# Patient Record
Sex: Female | Born: 1975 | Race: White | Hispanic: No | Marital: Married | Smoking: Never smoker
Health system: Southern US, Community
[De-identification: ages and names within clinical notes are randomized; demographics above are authoritative.]

## PROBLEM LIST (undated history)

## (undated) DIAGNOSIS — F419 Anxiety disorder, unspecified: Secondary | ICD-10-CM

## (undated) DIAGNOSIS — F329 Major depressive disorder, single episode, unspecified: Secondary | ICD-10-CM

## (undated) DIAGNOSIS — E039 Hypothyroidism, unspecified: Secondary | ICD-10-CM

## (undated) DIAGNOSIS — J45909 Unspecified asthma, uncomplicated: Secondary | ICD-10-CM

## (undated) DIAGNOSIS — F32A Depression, unspecified: Secondary | ICD-10-CM

## (undated) HISTORY — PX: CHOLECYSTECTOMY: SHX55

## (undated) HISTORY — PX: FOOT SURGERY: SHX648

## (undated) HISTORY — DX: Unspecified asthma, uncomplicated: J45.909

## (undated) HISTORY — PX: LYMPH NODE DISSECTION: SHX5087

## (undated) HISTORY — PX: ABDOMINAL HYSTERECTOMY: SHX81

## (undated) HISTORY — DX: Anxiety disorder, unspecified: F41.9

## (undated) HISTORY — PX: TONSILLECTOMY: SUR1361

## (undated) HISTORY — DX: Depression, unspecified: F32.A

## (undated) HISTORY — DX: Hypothyroidism, unspecified: E03.9

## (undated) HISTORY — DX: Major depressive disorder, single episode, unspecified: F32.9

---
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
## 1998-09-27 ENCOUNTER — Other Ambulatory Visit: Admission: RE | Admit: 1998-09-27 | Discharge: 1998-09-27 | Payer: Self-pay | Admitting: *Deleted

## 1999-03-26 ENCOUNTER — Encounter: Payer: Self-pay | Admitting: *Deleted

## 1999-03-26 ENCOUNTER — Ambulatory Visit (HOSPITAL_COMMUNITY): Admission: RE | Admit: 1999-03-26 | Discharge: 1999-03-26 | Payer: Self-pay | Admitting: *Deleted

## 1999-06-06 ENCOUNTER — Inpatient Hospital Stay (HOSPITAL_COMMUNITY): Admission: AD | Admit: 1999-06-06 | Discharge: 1999-06-06 | Payer: Self-pay | Admitting: *Deleted

## 1999-06-21 ENCOUNTER — Inpatient Hospital Stay (HOSPITAL_COMMUNITY): Admission: AD | Admit: 1999-06-21 | Discharge: 1999-07-01 | Payer: Self-pay | Admitting: *Deleted

## 1999-06-26 ENCOUNTER — Encounter: Payer: Self-pay | Admitting: *Deleted

## 1999-07-06 ENCOUNTER — Inpatient Hospital Stay (HOSPITAL_COMMUNITY): Admission: AD | Admit: 1999-07-06 | Discharge: 1999-07-08 | Payer: Self-pay | Admitting: *Deleted

## 1999-07-09 ENCOUNTER — Encounter (HOSPITAL_COMMUNITY): Admission: RE | Admit: 1999-07-09 | Discharge: 1999-10-07 | Payer: Self-pay | Admitting: *Deleted

## 2001-08-24 ENCOUNTER — Encounter: Payer: Self-pay | Admitting: Emergency Medicine

## 2001-08-24 ENCOUNTER — Emergency Department (HOSPITAL_COMMUNITY): Admission: EM | Admit: 2001-08-24 | Discharge: 2001-08-24 | Payer: Self-pay | Admitting: Emergency Medicine

## 2003-02-20 ENCOUNTER — Other Ambulatory Visit: Admission: RE | Admit: 2003-02-20 | Discharge: 2003-02-20 | Payer: Self-pay | Admitting: Obstetrics and Gynecology

## 2004-02-26 ENCOUNTER — Ambulatory Visit (HOSPITAL_COMMUNITY): Admission: RE | Admit: 2004-02-26 | Discharge: 2004-02-26 | Payer: Self-pay | Admitting: Family Medicine

## 2004-04-12 ENCOUNTER — Ambulatory Visit (HOSPITAL_COMMUNITY): Admission: RE | Admit: 2004-04-12 | Discharge: 2004-04-12 | Payer: Self-pay | Admitting: Otolaryngology

## 2004-04-12 ENCOUNTER — Encounter (INDEPENDENT_AMBULATORY_CARE_PROVIDER_SITE_OTHER): Payer: Self-pay | Admitting: Specialist

## 2004-04-12 ENCOUNTER — Ambulatory Visit (HOSPITAL_BASED_OUTPATIENT_CLINIC_OR_DEPARTMENT_OTHER): Admission: RE | Admit: 2004-04-12 | Discharge: 2004-04-12 | Payer: Self-pay | Admitting: Otolaryngology

## 2005-03-14 ENCOUNTER — Other Ambulatory Visit: Admission: RE | Admit: 2005-03-14 | Discharge: 2005-03-14 | Payer: Self-pay | Admitting: Obstetrics and Gynecology

## 2006-12-09 ENCOUNTER — Emergency Department (HOSPITAL_COMMUNITY): Admission: EM | Admit: 2006-12-09 | Discharge: 2006-12-09 | Payer: Self-pay | Admitting: Emergency Medicine

## 2007-09-24 ENCOUNTER — Observation Stay (HOSPITAL_COMMUNITY): Admission: EM | Admit: 2007-09-24 | Discharge: 2007-09-25 | Payer: Self-pay | Admitting: Neurosurgery

## 2007-09-24 ENCOUNTER — Encounter: Payer: Self-pay | Admitting: Emergency Medicine

## 2007-12-02 ENCOUNTER — Ambulatory Visit (HOSPITAL_COMMUNITY): Admission: RE | Admit: 2007-12-02 | Discharge: 2007-12-03 | Payer: Self-pay | Admitting: Obstetrics and Gynecology

## 2007-12-02 ENCOUNTER — Encounter (INDEPENDENT_AMBULATORY_CARE_PROVIDER_SITE_OTHER): Payer: Self-pay | Admitting: Obstetrics and Gynecology

## 2008-07-24 ENCOUNTER — Ambulatory Visit (HOSPITAL_COMMUNITY): Admission: RE | Admit: 2008-07-24 | Discharge: 2008-07-24 | Payer: Self-pay | Admitting: Family Medicine

## 2008-12-06 IMAGING — CR DG FINGER THUMB 2+V*R*
1 series · 1 of 1 positions shown · non-contrast
Comparison: None

CLINICAL DATA: Pain, kicked by horse

RIGHT THUMB 2+V

[view not recorded]
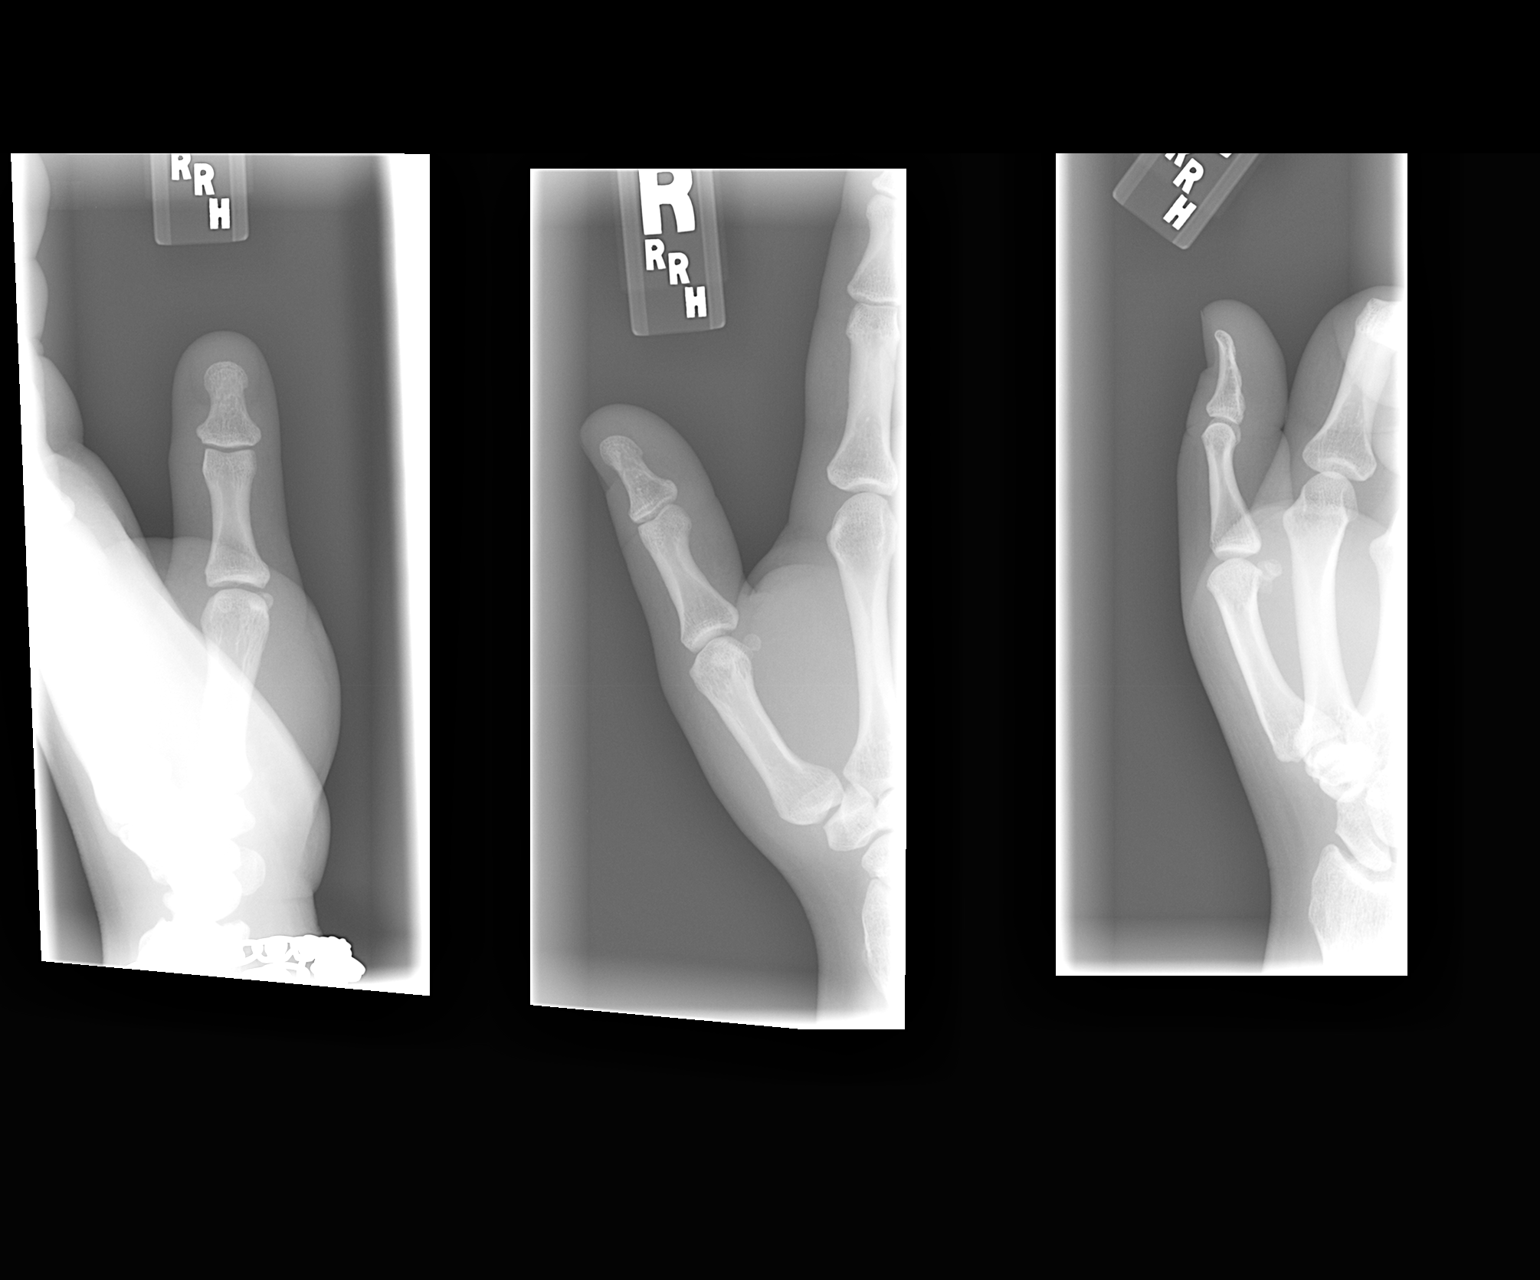

[1 of 1 positions shown; findings below may reference images not displayed]

FINDINGS: Bone mineralization normal.
Joint spaces preserved.
No fracture, dislocation, or bone destruction.
Soft tissue swelling overlies radial margin of first metacarpal.
The
IMPRESSION: No acute bony abnormalities.

## 2011-05-06 NOTE — H&P (Signed)
NAME:  Jill James, Jill James                   ACCOUNT NO.:  [id number]   MEDICAL RECORD NO.:  [id number]          PATIENT TYPE:  OIB   LOCATION:  9399                          FACILITY:  [facility]   PHYSICIAN:  Juluis Mire, M.D.   DATE OF BIRTH:  09-28-1976   DATE OF ADMISSION:  12/02/2007  DATE OF DISCHARGE:                              HISTORY & PHYSICAL   A 35 year old gravida 1, para 1 female who presents for laparoscopic-  assisted vaginal hysterectomy.   The patient has a longstanding history of abnormal uterine bleeding.  She had basically had an anovulatory cycle in the past with prolonged  periods.  She had a saline infusion ultrasound in 2006 that was normal.  She has had blood workup including a normal thyroid and prolactin.  We  have attempted to control it hormonally with things such as progesterone  which has been unresponsive.  After discussion of options, the patient  now wishes to proceed with laparoscopic-assisted vaginal hysterectomy.  Other alternatives such as birth control pills and attempts of  Prometrium have been declined.   ALLERGIES:  No known drug allergies.   MEDICATIONS:  Phentermine.   PAST MEDICAL HISTORY:  Usual childhood diseases.  No significant  sequelae.   PAST SURGICAL HISTORY:  She has no previous surgical history.   OBSTETRICAL HISTORY:  She has had one vaginal delivery.   FAMILY HISTORY:  There is a family history of pulmonary disease probably  from smoking and lung cancer.   SOCIAL HISTORY:  No tobacco or alcohol use.   REVIEW OF SYSTEMS:  Noncontributory.   PHYSICAL EXAMINATION:  VITAL SIGNS:  The patient is afebrile with stable  vital signs.  HEENT:  The patient is normocephalic.  Pupils equal, round, reactive to  light and accommodation.  Extraocular movements were intact.  Sclerae  and conjunctivae were clear.  NECK:  No thyromegaly.  LUNGS:  Clear.  CARDIOVASCULAR:  Regular rate without murmurs or gallops.  ABDOMEN:  Benign.  No mass,  organomegaly or tenderness.  PELVIC:  Normal external genitalia.  Vaginal mucosa is clear.  Cervix is  unremarkable.  Uterus is upper limits of normal in size, mildly boggy.  Adnexa unremarkable.  EXTREMITIES:  Trace edema.  NEUROLOGIC:  Grossly within normal limits.   IMPRESSION:  Abnormal uterine bleeding, unresponsive to conservative  management.   PLAN:  The patient to undergo laparoscopic-assisted vaginal  hysterectomy.  Alternatives again were discussed.  The risks of the  surgery were explained including the risk of infection, the risk of  hemorrhage that could require transfusion with the risk of AIDS or  hepatitis, the risk of injury to adjacent organs including bladder,  bowel, ureters that could require further exploratory surgery.  The risk  of deep venous thrombosis and pulmonary emboli.  The patient does also  understand she will not be able to conceive after this.  The patient  expressed her understanding of the indications, risks and other  alternatives.      Juluis Mire, M.D.  Electronically Signed     JSM/MEDQ  D:  12/02/2007  T:  12/02/2007  Job:  [id number]

## 2011-05-06 NOTE — Discharge Summary (Signed)
Jill James, Jill James                   ACCOUNT NO.:  [id number]   MEDICAL RECORD NO.:  [id number]          PATIENT TYPE:  OIB   LOCATION:  9316                          [facility]ACILITY:  [facility]   PHYSICIAN:  Juluis Mire, M.D.   DATE OF BIRTH:  1976/05/30   DATE OF ADMISSION:  12/02/2007  DATE OF DISCHARGE:  12/03/2007                               DISCHARGE SUMMARY   ADMITTING DIAGNOSIS:  Abnormal uterine bleeding.   DISCHARGE DIAGNOSIS:  Abnormal uterine bleeding.   PROCEDURE:  Laparoscopic-assisted vaginal hysterectomy.   For complete history and physical please see dictated note.   COURSE IN HOSPITAL:  The patient underwent the above-noted surgery.  Postop did well.  Postop hemoglobin was 11.4.  She was discharged home  on her first postoperative day.  At that time she was afebrile.  Her  vital signs were stable.  Her abdominal exam was unremarkable.  All  incisions were clear.  She was voiding without difficulty, had no active  bleeding.  The patient is discharged home in stable condition.   In terms of complications, none were encountered during her stay in the  hospital.  In terms of disposition, she will be taking Tylox as needed  for pain at home.  She will follow up in the office in 1 week.  She is  instructed to avoid heavy lifting, vaginal entrance or driving of a car.  She is to watch for signs of infection, any vomiting, increasing  abdominal pain, active vaginal bleeding, signs of deep venous  thrombosis, or pulmonary embolus.      Juluis Mire, M.D.  Electronically Signed     JSM/MEDQ  D:  12/03/2007  T:  12/03/2007  Job:  [id number]

## 2011-05-06 NOTE — Op Note (Signed)
NAMEFRANCENE, MCERLEAN                   ACCOUNT NO.:  [id number]   MEDICAL RECORD NO.:  [id number]          PATIENT TYPE:  OIB   LOCATION:  9399                          [facility]ILITY[facility]   PHYSICIAN:  Juluis Mire, M.D.   DATE OF BIRTH:  [birth year]   DATE OF PROCEDURE:  12/02/2007  DATE OF DISCHARGE:                               OPERATIVE REPORT   PREOPERATIVE DIAGNOSIS:  Abnormal uterine bleeding.   POSTOPERATIVE DIAGNOSIS:  Abnormal uterine bleeding.   OPERATIVE PROCEDURE:  Laparoscopically assisted vaginal hysterectomy.   SURGEON:  Juluis Mire, M.D.   ASSISTANT:  Duke Salvia. Marcelle Overlie, M.D.   ANESTHESIA:  General endotracheal.   ESTIMATED BLOOD LOSS:  300 mL.   PACKS DRAINS:  None.   INTRAOPERATIVE BLOOD REPLACED:  None.   COMPLICATIONS:  None.   INDICATIONS:  Noted in history and physical.   PROCEDURE:  The patient was taken to the OR, placed supine position.  After satisfactory level of general endotracheal anesthesia was  obtained, the patient was placed in dorsal lithotomy position using the  Allen stirrups.  At this point time the abdomen, perineum and vagina  cleansed with Betadine.  Bladder was emptied by in-and-out  catheterization.  A Hulka tenaculum was put in place.  Patient was  draped in sterile field.  Subumbilical incision was made with a knife.  Veress needle was introduced into the abdominal cavity.  Abdomen was  inflated with approximately 3 liters of carbon dioxide.  The operating  laparoscope was introduced.  There is no evidence of injury to adjacent  organs 5-mm trocar was put in place in suprapubic area under direct  visualization.  Visualization revealed the uterus to be normal.  Tubes  and ovaries were unremarkable.  Cul-de-sac was clear.  Appendix was  retrocecal unremarkable.  Upper abdomen, liver and tip of the  gallbladder were normal.  Lateral gutters were normal.  Next using the  gyrus the right utero-ovarian pedicle was cauterized incised.   The right  round ligament was cauterized incised.  We then went to the left side.  The left utero-ovarian pedicle was cauterized and incised.  The left  round ligament was cauterized and incised.  We had good hemostasis and  freeing of the adnexa.  This point time the laparoscope was removed, the  abdomen was deflated of carbon dioxide.   The patient's legs were repositioned.  The Hulka was then removed.  Weighted speculum was placed in vaginal vault.  Cervix was grasped with  Christella Hartigan tenaculum.  Cul-de-sac was entered sharply.  Both uterosacral  ligaments were clamped, cut and suture ligated with 0 Vicryl.  The  reflection of vaginal mucosa anteriorly was incised and bladder was  dissected superiorly.  Vesicouterine space was identified and entered  sharply and retractor was put in place.  Using clamped, cut and tie  technique with suture ligatures of 0 Vicryl.  The parametrium serially  separated from the sides of the uterus.  Uterus then flipped, remaining  pedicles were clamped and cut.  Uterus and cervix passed off the  operative field  sent to pathology.  Held pedicles secured with free tie  of 0 Vicryl.  Uterosacral plication stitch of 0  Vicryl was put in place  and secured.  Vaginal mucosa was reapproximated in vertical fashion with  figure-of-eights of 0 Monocryl.  Foley was placed straight drain with  clear urine.   The patient's legs repositioned.  Laparoscope was reintroduced.  Minimal  bleeding was noted from the cuff, brought under control with the Gyrus.  The ovaries were hemostatically intact.  We irrigated the pelvis,  removed irrigation.  At this point in time the abdomen was deflated of  its carbon dioxide.  All trocars removed.  Subumbilical incision was  closed interrupted subcuticulars of 4-0 Vicryl.  The suprapubic incision  closed with Dermabond.  The patient taken out of dorsal lithotomy  position.  Once alert and extubated transferred to recovery room good   condition.  Sponge, instrument and needle count was reported correct by  circulating nurse x2.      Juluis Mire, M.D.  Electronically Signed     JSM/MEDQ  D:  12/02/2007  T:  12/02/2007  Job:  [id number]
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
## 2011-05-09 NOTE — Op Note (Signed)
NAME:  Jill James, Jill James                          ACCOUNT NO.:  [id number]   MEDICAL RECORD NO.:  [id number]                   PATIENT TYPE:  AMB   LOCATION:  DSC                                  FACILITY:  [facility]   PHYSICIAN:  Lucky Cowboy, M.D.                    DATE OF BIRTH:  1976/07/11   DATE OF PROCEDURE:  04/12/2004  DATE OF DISCHARGE:                                 OPERATIVE REPORT   PREOPERATIVE DIAGNOSIS:  Right neck mass.   POSTOPERATIVE DIAGNOSIS:  Right neck mass.   PROCEDURE:  Excisional biopsy, right neck mass.   SURGEON:  Lucky Cowboy, M.D.   ANESTHESIA:  General.   ESTIMATED BLOOD LOSS:  None.   COMPLICATIONS:  None.   INDICATIONS:  This patient is a 35 year old female who has a 78-month history  of painful and gradually enlarging right zone 2 neck mass.  She has been  tried on antibiotic therapy without regression.  For these reasons,  excisional biopsy is performed.   DESCRIPTION OF PROCEDURE:  The patient was taken to the operating room and  placed on the table in the supine position.  She was then placed under  general endotracheal anesthesia, and the table rotated counter-clockwise 90  degrees.  The neck was gently extended.  The right neck was prepped with  Betadine and draped in the usual sterile fashion.  The area overlying the  lymph node was marked.  A transverse incision was made which was 3 cm. This  was made using a 15 blade and subcutaneous tissues divided with scissors and  Bovie cautery.  The lymph node was identified and measured approximately 2  cm x 1.5 cm in dimension.  It was sharply dissected using scissors, staying  immediately adjacent to the capsule.  In this manner, it was dissected off  the sternocleidomastoid muscle and adjacent vein.  The platysmal muscle was  reapproximated in a simple, interrupted buried fashion using 4-0 Vicryl.  The skin was closed in a simple, interrupted fashion using 5-0 Prolene.  Bacitracin ointment was applied,  and the patient awakened from anesthesia.  She was taken to the post-anesthesia care unit in stable condition.   COMPLICATIONS:  None.                                               Lucky Cowboy, M.D.    SJ/MEDQ  D:  04/12/2004  T:  04/13/2004  Job:  [id number]   cc:   [facility]   Donna Bernard, M.D.  [address]  [place]  [place] [address]  Fax: [phone]

## 2011-06-10 ENCOUNTER — Other Ambulatory Visit: Payer: Self-pay | Admitting: Family Medicine

## 2011-06-10 DIAGNOSIS — R52 Pain, unspecified: Secondary | ICD-10-CM

## 2011-06-11 ENCOUNTER — Ambulatory Visit (HOSPITAL_COMMUNITY)
Admission: RE | Admit: 2011-06-11 | Discharge: 2011-06-11 | Disposition: A | Discharge status: Home / Self Care | Payer: Managed Care, Other (non HMO) | Source: Ambulatory Visit | Attending: Family Medicine | Admitting: Family Medicine

## 2011-06-11 DIAGNOSIS — R109 Unspecified abdominal pain: Secondary | ICD-10-CM | POA: Insufficient documentation

## 2011-06-11 DIAGNOSIS — R52 Pain, unspecified: Secondary | ICD-10-CM

## 2011-06-11 DIAGNOSIS — K8010 Calculus of gallbladder with chronic cholecystitis without obstruction: Secondary | ICD-10-CM | POA: Insufficient documentation

## 2011-06-12 ENCOUNTER — Encounter (HOSPITAL_COMMUNITY)
Admission: RE | Admit: 2011-06-12 | Discharge: 2011-06-12 | Disposition: A | Discharge status: Home / Self Care | Payer: Managed Care, Other (non HMO) | Source: Ambulatory Visit | Attending: General Surgery | Admitting: General Surgery

## 2011-06-12 LAB — BASIC METABOLIC PANEL
BUN: 11 mg/dL (ref 6–23)
CO2: 29 mEq/L (ref 19–32)
Chloride: 103 mEq/L (ref 96–112)
GFR calc Af Amer: >60 mL/min (ref >60)
Potassium: 4 mEq/L (ref 3.5–5.1)

## 2011-06-12 LAB — CBC
HCT: 40.8 % (ref 36.0–46.0)
Hemoglobin: 13.7 g/dL (ref 12.0–15.0)
RBC: 4.56 MIL/uL (ref 3.87–5.11)
RDW: 12.3 % (ref 11.5–15.5)
WBC: 6.8 10*3/uL (ref 4.0–10.5)

## 2011-06-13 ENCOUNTER — Ambulatory Visit (HOSPITAL_COMMUNITY)
Admission: RE | Admit: 2011-06-13 | Discharge: 2011-06-15 | Disposition: A | Discharge status: Home / Self Care | Payer: Managed Care, Other (non HMO) | Source: Ambulatory Visit | Attending: General Surgery | Admitting: General Surgery

## 2011-06-13 ENCOUNTER — Other Ambulatory Visit: Payer: Self-pay | Admitting: General Surgery

## 2011-06-13 DIAGNOSIS — K8020 Calculus of gallbladder without cholecystitis without obstruction: Secondary | ICD-10-CM | POA: Insufficient documentation

## 2011-06-13 DIAGNOSIS — R109 Unspecified abdominal pain: Secondary | ICD-10-CM | POA: Insufficient documentation

## 2011-06-13 DIAGNOSIS — Z01812 Encounter for preprocedural laboratory examination: Secondary | ICD-10-CM | POA: Insufficient documentation

## 2011-06-17 NOTE — Op Note (Signed)
Jill James, GEIS                   ACCOUNT NO.:  [id number]  MEDICAL RECORD NO.:  [id number]  LOCATION:                                 FACILITY:  PHYSICIAN:  Tilford Pillar, MD      DATE OF BIRTH:  06/13/1976  DATE OF PROCEDURE:  06/13/2011 DATE OF DISCHARGE:                              OPERATIVE REPORT   PREOPERATIVE DIAGNOSES:  Acute cholecystitis and cholelithiasis.  POSTOPERATIVE DIAGNOSES:  Acute cholecystitis and cholelithiasis.  PROCEDURE:  Laparoscopic cholecystectomy.  SURGEON:  Tilford Pillar, MD  ANESTHESIA:  General endotracheal, local anesthetic 0.5% Sensorcaine plain.  SPECIMEN:  Gallbladder.  ESTIMATED BLOOD LOSS:  Minimal.  INDICATIONS:  The patient is a 35 year old female who presented to my office with a history of right upper quadrant epigastric abdominal pain with radiation to the right back and shoulder.  Her workup and evaluation was consistent with acute cholecystitis and chololithiasis. Risks, benefits, and alternatives of laparoscopic possible open cholecystectomy were discussed at length with the patient including but not limited to bleeding, infection, bile leak, small bowel injury, common bile duct injury, as well as possibility of intraoperative cardiopulmonary events.  The patient's questions and concerns were addressed.  The patient was consented for planned procedure.  OPERATION:  The patient was taken to the operating room and was placed in supine position on the operating  room table.  At which time, a general anesthetic was administered.  Once the patient was asleep, she was endotracheally intubated by Anesthesia.  At this point, her abdomen was prepped with DuraPrep solution and draped in standard fashioned. Stab incision was created infraumbilically with 11-blade scalpel. Additional dissection down to the subcutaneous tissue was carried out using a Kocher clamp was utilized to grasp the anterior abdominal fascia anteriorly.  The  Veress needle was inserted.  Saline drop test was utilized to confirm  intraperitoneal placement and then pneumoperitoneum was initiated.  Once sufficient pneumoperitoneum was obtained, a 11-mm trocar was inserted over laparoscope allowing visualization of the trocar entering into peritoneal cavity.  At this time, the inner cannula was removed.  The laparoscope was reinserted.  No evidence of any trocar or Veress needle placement injury.  At this time, the remaining trocars were placed.  A 11-mm trocar was placed in the epigastrium,  a 5-mm trocar was placed in the midline between the two 11-mm trocars and the 5- mm trocars placed in the right lateral abdominal wall.  The fundus of the gallbladder was grasped and lifted up and over the right lobe of the liver.  Peritoneal reflection on the infundibulum was bluntly stripped using [facility], exposing the cystic duct as it enters into the infundibulum.  A window was created behind the cystic duct.  Three EndoClips were placed proximally, one distally, and the cystic duct was divided between the two most distal clips.  Similarly, the cystic artery was identified.  Two EndoClips were placed proximally and one distally and the cystic artery was divided between the two most distal clips. At this time, electrocautery utilized to dissect the gallbladder free from the gallbladder fossa.  Once it was free, it was placed in the EndoCatch bag  was placed over the right lobe of liver.  At this time, section of the gallbladder fossa indicated excellent hemostasis.  There was no evidence of any bleeding or bile leak. __________ ducts and artery.  At this point, attention was turned to closure.  At this point, an Endoclose suture passing device was utilized to pass 2-0 Vicryl sutures to both the 11-mm trocar sites.  With these sutures in place, the gallbladder was grasped and retrieved from the umbilical trocar site, intact in EndoCatch bag.   Gallbladder was placed on the back table and sent as a permanent specimen to Pathology.  At this time, the pneumoperitoneum was evacuated.  Trocars were removed. The Vicryl sutures were secured.  The local anesthetic was instilled, 4- 0 Monocryl was utilized to reapproximate skin edges at all 4 trocar sites.  Skin was washed and dried with moist and dry towel.  Benzoin was applied around incision.  Half-inch Steri-Strips were placed.  Drapes removed.  The patient was allowed to come out of general anesthetic. The patient was transferred back to regular hospital bed in stable condition.  At the conclusion of the procedure, all instrument, sponge, and needle counts were correct.  The patient tolerated the procedure extremely well.     Tilford Pillar, MD     BZ/MEDQ  D:  06/16/2011  T:  06/16/2011  Job:  [id number]  Electronically Signed by Tilford Pillar MD on 06/17/2011 05:46:39 PM

## 2011-09-29 LAB — CBC
HCT: 41.9
Hemoglobin: 11.4 — ABNORMAL LOW
Hemoglobin: 14.4
MCHC: 34.2
Platelets: 301
RBC: 3.76 — ABNORMAL LOW
WBC: 5
WBC: 9.2

## 2011-10-02 LAB — DIFFERENTIAL
Basophils Absolute: 0.1
Basophils Relative: 1
Eosinophils Absolute: 0.5
Eosinophils Relative: 5
Monocytes Absolute: 0.6
Monocytes Relative: 6

## 2011-10-02 LAB — BASIC METABOLIC PANEL
CO2: 27
Calcium: 9.1
Chloride: 103
GFR calc Af Amer: >60
Glucose, Bld: 95
Sodium: 137

## 2011-10-02 LAB — CBC
HCT: 40.9
Hemoglobin: 13.9
MCHC: 34.1
MCV: 87.1
RBC: 4.69
RDW: 12.1

## 2011-10-02 LAB — APTT: aPTT: 29

## 2013-06-07 ENCOUNTER — Telehealth: Payer: Self-pay | Admitting: Family Medicine

## 2013-06-07 NOTE — Telephone Encounter (Signed)
error 

## 2013-06-09 ENCOUNTER — Ambulatory Visit: Payer: Self-pay | Admitting: Family Medicine

## 2013-06-29 ENCOUNTER — Telehealth: Payer: Self-pay | Admitting: Family Medicine

## 2013-06-29 DIAGNOSIS — E039 Hypothyroidism, unspecified: Secondary | ICD-10-CM

## 2013-06-29 MED ORDER — LEVOTHYROXINE SODIUM 25 MCG PO TABS: 25.0000 ug | ORAL_TABLET | Freq: Every day | ORAL | Status: DC

## 2013-06-29 NOTE — Telephone Encounter (Signed)
30 d only. tsh plus ov

## 2013-06-29 NOTE — Telephone Encounter (Signed)
Patient needs a refill of her levothyroxine to [facility]

## 2013-06-29 NOTE — Telephone Encounter (Signed)
Discussed with patient. Med faxed to [facility]. Blood work papers printed and left up front for patient pick up. Patient scheduled follow up office visit.

## 2013-06-29 NOTE — Telephone Encounter (Signed)
Not seen in over a year. LAST SEEN 05/13/2012

## 2013-07-01 ENCOUNTER — Encounter: Payer: Self-pay | Admitting: *Deleted

## 2013-07-06 ENCOUNTER — Encounter: Payer: Self-pay | Admitting: Nurse Practitioner

## 2013-07-06 ENCOUNTER — Ambulatory Visit (INDEPENDENT_AMBULATORY_CARE_PROVIDER_SITE_OTHER): Payer: BC Managed Care – PPO | Admitting: Nurse Practitioner

## 2013-07-06 VITALS — BP 122/86 | HR 70 | Ht 60.75 in | Wt 180.0 lb

## 2013-07-06 DIAGNOSIS — F418 Other specified anxiety disorders: Secondary | ICD-10-CM

## 2013-07-06 DIAGNOSIS — F341 Dysthymic disorder: Secondary | ICD-10-CM

## 2013-07-06 DIAGNOSIS — Z0000 Encounter for general adult medical examination without abnormal findings: Secondary | ICD-10-CM

## 2013-07-06 DIAGNOSIS — J300 Vasomotor rhinitis: Secondary | ICD-10-CM

## 2013-07-06 DIAGNOSIS — J309 Allergic rhinitis, unspecified: Secondary | ICD-10-CM

## 2013-07-06 DIAGNOSIS — Z01419 Encounter for gynecological examination (general) (routine) without abnormal findings: Secondary | ICD-10-CM

## 2013-07-06 DIAGNOSIS — Z23 Encounter for immunization: Secondary | ICD-10-CM

## 2013-07-06 MED ORDER — CITALOPRAM HYDROBROMIDE 20 MG PO TABS: 20.0000 mg | ORAL_TABLET | Freq: Every day | ORAL | Status: DC

## 2013-07-06 NOTE — Progress Notes (Signed)
  Subjective:    Patient ID: Jill James, female    DOB: [birth year], 37 y.o.   MRN: [id number]  HPI presents for her wellness checkup. Complaints of feeling depressed for the past few months. Fatigue. Increase intake of simple carbs and sugar. Social isolation. Emotional lability. Occasional headache. Difficulty sleeping. Denies any suicidal or homicidal thoughts or ideation. Patient has been treated for depression and anxiety in the past, was on Celexa in 2012. No specific triggers. No relationship problems. Patient has a same sex partner. Minimal alcohol intake, occasional beer. Concerned about her weight gain. No pelvic pain. No discharge. Has had a hysterectomy. Regular vision care. Regular dental care. Some heartburn and acid reflux which is controlled with OTC meds. Had lab work drawn today, results unavailable during office visit.    Review of Systems  Constitutional: Positive for appetite change and fatigue. Negative for activity change.  HENT: Positive for congestion and postnasal drip. Negative for ear pain, sore throat, dental problem and sinus pressure.   Eyes: Negative for visual disturbance.  Respiratory: Negative for cough, chest tightness, shortness of breath and wheezing.   Cardiovascular: Negative for chest pain, palpitations and leg swelling.  Gastrointestinal: Negative for nausea, vomiting, abdominal pain, diarrhea, constipation, blood in stool and abdominal distention.  Genitourinary: Negative for dysuria, urgency, frequency, vaginal discharge, difficulty urinating and pelvic pain.  Neurological: Positive for headaches.  Psychiatric/Behavioral: Positive for sleep disturbance and dysphoric mood. Negative for suicidal ideas.       Objective:   Physical Exam  Vitals reviewed. Constitutional: She is oriented to person, place, and time. She appears well-developed. No distress.  HENT:  Right Ear: External ear normal.  Left Ear: External ear normal.  Mouth/Throat: Oropharynx is  clear and moist.  Neck: Normal range of motion. Neck supple. No tracheal deviation present. No thyromegaly present.  Cardiovascular: Normal rate, regular rhythm and normal heart sounds.  Exam reveals no gallop.   No murmur heard. Pulmonary/Chest: Effort normal and breath sounds normal.  Abdominal: Soft. She exhibits no distension. There is no tenderness.  Genitourinary: No vaginal discharge found.  Musculoskeletal: She exhibits no edema.  Lymphadenopathy:    She has no cervical adenopathy.  Neurological: She is alert and oriented to person, place, and time.  Skin: Skin is warm and dry. No rash noted.  Psychiatric: She has a normal mood and affect. Her behavior is normal.   Breast exam in no masses, axilla no adenopathy. External GU normal limit. Bimanual exam normal. Crying a few times during office visit. TMs mild clear effusion, no erythema. Pharynx mildly injected with cloudy PND noted.      Assessment & Plan:  Well woman exam  Need for tetanus booster - Plan: Dt vaccine greater than 7yo IM  Vasomotor rhinitis  Depression with anxiety  Meds ordered this encounter  Medications  . citalopram (CELEXA) 20 MG tablet    Sig: Take 1 tablet (20 mg total) by mouth daily.    Dispense:  30 tablet    Refill:  5    Order Specific Question:  Supervising Provider    Answer:  Merlyn Albert [[id number]]  Call back in 2-3 weeks if no improvement in her depression symptoms. Seek help immediately if symptoms worsen. Encouraged daily multivitamin including vitamin D and calcium. Regular activity. Patient defers mental health counseling at this time. If symptoms improve, recheck in 6 months. Recheck sooner if needed.  OTC meds as directed for head congestion.

## 2013-07-07 ENCOUNTER — Encounter: Payer: Self-pay | Admitting: Nurse Practitioner

## 2013-07-07 DIAGNOSIS — F418 Other specified anxiety disorders: Secondary | ICD-10-CM | POA: Insufficient documentation

## 2013-07-07 NOTE — Assessment & Plan Note (Signed)
Call back in 2-3 weeks if no improvement in her depression symptoms. Seek help immediately if symptoms worsen. Encouraged daily multivitamin including vitamin D and calcium. Regular activity. Patient defers mental health counseling at this time. If symptoms improve, recheck in 6 months. Recheck sooner if needed

## 2013-07-27 ENCOUNTER — Encounter: Payer: Self-pay | Admitting: Family Medicine

## 2013-08-01 ENCOUNTER — Other Ambulatory Visit: Payer: Self-pay | Admitting: *Deleted

## 2013-08-01 MED ORDER — LEVOTHYROXINE SODIUM 25 MCG PO TABS: 25.0000 ug | ORAL_TABLET | Freq: Every day | ORAL | Status: DC

## 2013-08-03 ENCOUNTER — Ambulatory Visit: Payer: BC Managed Care – PPO | Admitting: Nurse Practitioner

## 2013-08-16 ENCOUNTER — Telehealth: Payer: Self-pay | Admitting: Family Medicine

## 2013-08-16 MED ORDER — PREDNISONE 20 MG PO TABS: ORAL_TABLET | ORAL | Status: DC

## 2013-08-16 NOTE — Telephone Encounter (Signed)
Patient would like a prescription for Prednisone for [facility] phone into [facility].  Please call Patient. Thanks

## 2013-08-16 NOTE — Telephone Encounter (Signed)
pred 20 3qd for three d two qd for three d one qd for two d

## 2013-08-16 NOTE — Telephone Encounter (Signed)
Sent in pred 20 3qd for three d two qd for three d one qd for two days to pharmacy. Patient was notified.

## 2013-10-27 ENCOUNTER — Other Ambulatory Visit: Payer: Self-pay

## 2014-01-03 ENCOUNTER — Other Ambulatory Visit: Payer: Self-pay | Admitting: *Deleted

## 2014-01-03 ENCOUNTER — Other Ambulatory Visit: Payer: Self-pay | Admitting: Nurse Practitioner

## 2014-01-03 MED ORDER — CITALOPRAM HYDROBROMIDE 20 MG PO TABS: 20.0000 mg | ORAL_TABLET | Freq: Every day | ORAL | Status: DC

## 2014-01-31 ENCOUNTER — Other Ambulatory Visit: Payer: Self-pay | Admitting: Nurse Practitioner

## 2014-02-06 ENCOUNTER — Encounter: Payer: Self-pay | Admitting: Family Medicine

## 2014-02-06 ENCOUNTER — Ambulatory Visit (INDEPENDENT_AMBULATORY_CARE_PROVIDER_SITE_OTHER): Payer: BC Managed Care – PPO | Admitting: Family Medicine

## 2014-02-06 VITALS — BP 128/88 | Temp 98.4°F | Ht 61.75 in | Wt 180.0 lb

## 2014-02-06 DIAGNOSIS — J683 Other acute and subacute respiratory conditions due to chemicals, gases, fumes and vapors: Secondary | ICD-10-CM

## 2014-02-06 DIAGNOSIS — J45909 Unspecified asthma, uncomplicated: Secondary | ICD-10-CM

## 2014-02-06 DIAGNOSIS — J209 Acute bronchitis, unspecified: Secondary | ICD-10-CM

## 2014-02-06 MED ORDER — LEVOFLOXACIN 500 MG PO TABS: 500.0000 mg | ORAL_TABLET | Freq: Every day | ORAL | Status: DC

## 2014-02-06 MED ORDER — ALBUTEROL SULFATE HFA 108 (90 BASE) MCG/ACT IN AERS: 2.0000 | INHALATION_SPRAY | Freq: Four times a day (QID) | RESPIRATORY_TRACT | Status: DC | PRN

## 2014-02-06 NOTE — Progress Notes (Signed)
   Subjective:    Patient ID: Jill James, female    DOB: 10/21/1976, 38 y.o.   MRN: [id number]  Cough This is a new problem. The current episode started in the past 7 days. Associated symptoms include a fever, headaches, myalgias and a sore throat. Associated symptoms comments: vomiting. Treatments tried: robitussium, ibuprofen.  bad cough, and bad headache  Severe cough and vomiting wityh it Frontal headaches and severe Yellow prod cough  Review of Systems  Constitutional: Positive for fever.  HENT: Positive for sore throat.   Respiratory: Positive for cough.   Musculoskeletal: Positive for myalgias.  Neurological: Positive for headaches.       Objective:   Physical Exam See below       Assessment & Plan:  In an in alert moderate malaise. HEENT moderate nasal congestion pharynx normal intermittent bronchial cough during exam. Expiratory wheezes no inspiratory crackles no tachypnea heart regular in rhythm.  Impression post flu bronchitis with reactive airways discussed plan Levaquin 500 milligrams daily 10 days. Symptomatic care discussed. Ventolin when necessary for wheezes. WSL

## 2014-03-06 ENCOUNTER — Other Ambulatory Visit: Payer: Self-pay | Admitting: Family Medicine

## 2014-03-15 ENCOUNTER — Encounter: Payer: Self-pay | Admitting: Family Medicine

## 2014-03-15 ENCOUNTER — Ambulatory Visit (INDEPENDENT_AMBULATORY_CARE_PROVIDER_SITE_OTHER): Payer: BC Managed Care – PPO | Admitting: Family Medicine

## 2014-03-15 VITALS — BP 124/80 | Ht 61.75 in | Wt 179.0 lb

## 2014-03-15 DIAGNOSIS — J209 Acute bronchitis, unspecified: Secondary | ICD-10-CM

## 2014-03-15 DIAGNOSIS — J683 Other acute and subacute respiratory conditions due to chemicals, gases, fumes and vapors: Secondary | ICD-10-CM

## 2014-03-15 DIAGNOSIS — J45909 Unspecified asthma, uncomplicated: Secondary | ICD-10-CM

## 2014-03-15 MED ORDER — HYDROCODONE-HOMATROPINE 5-1.5 MG/5ML PO SYRP: ORAL_SOLUTION | ORAL | Status: DC

## 2014-03-15 MED ORDER — LEVOFLOXACIN 500 MG PO TABS: 500.0000 mg | ORAL_TABLET | Freq: Every day | ORAL | Status: DC

## 2014-03-15 NOTE — Progress Notes (Signed)
   Subjective:    Patient ID: Jill James, female    DOB: 01/16/1976, 38 y.o.   MRN: [id number]  HPI Comments: Patient was just seen here in Feb for the same symptoms.  Pt also c/o diarrhea and gas. She had her gallbladder removed 2 years ago. For the past 2 weeks she has been having worsening symptoms. Every time she eats, she now has diarrhea and gas pains.   Cough This is a recurrent problem. The problem occurs constantly. Associated symptoms include headaches. Her past medical history is significant for bronchitis.    Scratchy throat   Now bronvhial, wheezy at times  No fever  Headache frontal and driscomfort   Review of Systems  Respiratory: Positive for cough.   Neurological: Positive for headaches.       Objective:   Physical Exam  Alert no acute distress. Slight malaise. HEENT moderate nasal congestion. Pharynx normal. Neck supple. Lungs bilateral minimal wheezes. Bronchial cough during exam. No tachypnea heart regular in rhythm.      Assessment & Plan:  Impression acute bronchitis with reactive airways plan Ventolin 2 sprays 4 times a day. Hycodan when necessary at night. Antibiotics prescribed. Symptomatic care discussed. WSL

## 2014-07-07 ENCOUNTER — Encounter: Payer: Self-pay | Admitting: Nurse Practitioner

## 2014-07-07 ENCOUNTER — Ambulatory Visit (INDEPENDENT_AMBULATORY_CARE_PROVIDER_SITE_OTHER): Payer: BC Managed Care – PPO | Admitting: Nurse Practitioner

## 2014-07-07 VITALS — BP 110/80 | Ht 61.0 in | Wt 165.5 lb

## 2014-07-07 DIAGNOSIS — E039 Hypothyroidism, unspecified: Secondary | ICD-10-CM

## 2014-07-07 DIAGNOSIS — Z79899 Other long term (current) drug therapy: Secondary | ICD-10-CM

## 2014-07-07 DIAGNOSIS — R5383 Other fatigue: Secondary | ICD-10-CM

## 2014-07-07 DIAGNOSIS — Z0000 Encounter for general adult medical examination without abnormal findings: Secondary | ICD-10-CM

## 2014-07-07 DIAGNOSIS — F418 Other specified anxiety disorders: Secondary | ICD-10-CM

## 2014-07-07 DIAGNOSIS — Z01419 Encounter for gynecological examination (general) (routine) without abnormal findings: Secondary | ICD-10-CM

## 2014-07-07 DIAGNOSIS — R5381 Other malaise: Secondary | ICD-10-CM

## 2014-07-07 DIAGNOSIS — F341 Dysthymic disorder: Secondary | ICD-10-CM

## 2014-07-07 MED ORDER — PHENTERMINE HCL 37.5 MG PO TABS: 37.5000 mg | ORAL_TABLET | Freq: Every day | ORAL | Status: DC

## 2014-07-07 MED ORDER — CITALOPRAM HYDROBROMIDE 20 MG PO TABS: ORAL_TABLET | ORAL | Status: DC

## 2014-07-08 ENCOUNTER — Encounter: Payer: Self-pay | Admitting: Nurse Practitioner

## 2014-07-08 NOTE — Progress Notes (Signed)
Subjective:    Patient ID: Jill James, female    DOB: 1976/09/27, 37 y.o.   MRN: [id number]  HPI presents for her wellness exam. Active lifestyle. Diet is much healthier. Regular vision and dental exams. Has experienced some fatigue and mild depression, having some relationship issues. Her partner is present today. Still living together but as friends. No suicidal or homicidal thoughts or ideation.    Review of Systems  Constitutional: Positive for fatigue. Negative for activity change and appetite change.  HENT: Negative for dental problem, ear pain, sinus pressure and sore throat.   Respiratory: Negative for cough, chest tightness, shortness of breath and wheezing.   Cardiovascular: Negative for chest pain and leg swelling.  Gastrointestinal: Negative for nausea, vomiting, abdominal pain, diarrhea, constipation and abdominal distention.  Genitourinary: Negative for dysuria, urgency, frequency, vaginal discharge, enuresis, difficulty urinating, genital sores and pelvic pain.  Psychiatric/Behavioral: Positive for dysphoric mood.       Objective:   Physical Exam  Vitals reviewed. Constitutional: She is oriented to person, place, and time. She appears well-developed. No distress.  HENT:  Right Ear: External ear normal.  Left Ear: External ear normal.  Mouth/Throat: Oropharynx is clear and moist.  Neck: Normal range of motion. Neck supple. No tracheal deviation present. No thyromegaly present.  Cardiovascular: Normal rate, regular rhythm and normal heart sounds.  Exam reveals no gallop.   No murmur heard. Pulmonary/Chest: Effort normal and breath sounds normal.  Abdominal: Soft. She exhibits no distension. There is no tenderness.  Genitourinary: No vaginal discharge found.  External GU no lesions or erythema. Bimanual exam: No tenderness or obvious masses.  Musculoskeletal: She exhibits no edema.  Lymphadenopathy:    She has no cervical adenopathy.  Neurological: She is alert and  oriented to person, place, and time.  Skin: Skin is warm and dry. No rash noted.  Psychiatric: She has a normal mood and affect. Her behavior is normal.   Breast exam: Minimal fine nodularity, no dominant masses; axillae no adenopathy.        Assessment & Plan:   Problem List Items Addressed This Visit     Other   Depression with anxiety    Other Visit Diagnoses   Well woman exam    -  Primary    Relevant Orders       Lipid panel    Encounter for long-term (current) use of other medications        Relevant Orders       Hepatic function panel       Basic metabolic panel    Unspecified hypothyroidism        Relevant Orders       TSH    Other malaise and fatigue        Relevant Orders       CBC with Differential       Vit D  25 hydroxy (rtn osteoporosis monitoring)       Vitamin B12       Encouraged healthy diet and regular activity. Daily vitamin D and calcium supplementation. Increase Celexa to one and a half tabs by mouth daily. Call back if no improvement in depression symptoms. Also restart phentermine to help with weight loss, has taken this in the past without difficulty. Meds ordered this encounter  Medications  . phentermine (ADIPEX-P) 37.5 MG tablet    Sig: Take 1 tablet (37.5 mg total) by mouth daily before breakfast.    Dispense:  30 tablet  Refill:  2    Order Specific Question:  Supervising Provider    Answer:  Merlyn AlbertLUKING, WILLIAM S [[id number]]  . citalopram (CELEXA) 20 MG tablet    Sig: 1 1/2 tabs po qd    Dispense:  45 tablet    Refill:  5    Order Specific Question:  Supervising Provider    Answer:  Merlyn AlbertLUKING, WILLIAM S [[id number]]   Return in about 3 months (around 10/07/2014).

## 2014-08-15 ENCOUNTER — Encounter: Payer: Self-pay | Admitting: Family Medicine

## 2014-08-15 ENCOUNTER — Ambulatory Visit (INDEPENDENT_AMBULATORY_CARE_PROVIDER_SITE_OTHER): Payer: BC Managed Care – PPO | Admitting: Family Medicine

## 2014-08-15 VITALS — BP 118/78 | Ht 61.0 in | Wt 155.0 lb

## 2014-08-15 DIAGNOSIS — R21 Rash and other nonspecific skin eruption: Secondary | ICD-10-CM

## 2014-08-15 MED ORDER — TRIAMCINOLONE ACETONIDE 0.1 % EX CREA: 1.0000 "application " | TOPICAL_CREAM | Freq: Two times a day (BID) | CUTANEOUS | Status: DC

## 2014-08-15 MED ORDER — PREDNISONE 20 MG PO TABS: ORAL_TABLET | ORAL | Status: DC

## 2014-08-15 NOTE — Progress Notes (Signed)
   Subjective:    Patient ID: Jill James, female    DOB: 1976-07-02, 38 y.o.   MRN: [id number]  HPI Patient states she is now in day 3 of having poison oak which is now spreading [place].  pois oak patient has challenge with being very allergic to this.  Got out and it this past weekend. Tried to wash her hands. Next  Developed impressive rash on neck anterior chest. Lower abdomen. Wrists form  pois oak cream ,,day numb three started morn    Review of Systems No headache no chest pain no back pain no abdominal pain no change in bowel habits ROS otherwise negative    Objective:   Physical Exam  Alert no apparent distress. Lungs clear. Heart regular rate and rhythm. H&T normal. Skin multiple discrete erythematous patches. Linear component. Blistering noted.      Assessment & Plan:  Impression poison oak dermatitis plan prednisone taper. Local measures discussed. Avoidance discussed. Triamcinolone twice a day. WSL

## 2014-08-31 ENCOUNTER — Other Ambulatory Visit: Payer: Self-pay | Admitting: Family Medicine

## 2014-11-01 ENCOUNTER — Encounter: Payer: Self-pay | Admitting: Nurse Practitioner

## 2014-11-01 ENCOUNTER — Ambulatory Visit (INDEPENDENT_AMBULATORY_CARE_PROVIDER_SITE_OTHER): Payer: BC Managed Care – PPO | Admitting: Nurse Practitioner

## 2014-11-01 ENCOUNTER — Encounter: Payer: Self-pay | Admitting: Family Medicine

## 2014-11-01 VITALS — BP 128/80 | Ht 61.0 in | Wt 163.0 lb

## 2014-11-01 DIAGNOSIS — F418 Other specified anxiety disorders: Secondary | ICD-10-CM

## 2014-11-01 DIAGNOSIS — E038 Other specified hypothyroidism: Secondary | ICD-10-CM

## 2014-11-01 MED ORDER — LEVOTHYROXINE SODIUM 25 MCG PO TABS: ORAL_TABLET | ORAL | Status: DC

## 2014-11-01 MED ORDER — CITALOPRAM HYDROBROMIDE 20 MG PO TABS: ORAL_TABLET | ORAL | Status: DC

## 2014-11-02 ENCOUNTER — Encounter: Payer: Self-pay | Admitting: Nurse Practitioner

## 2014-11-02 DIAGNOSIS — E038 Other specified hypothyroidism: Secondary | ICD-10-CM | POA: Insufficient documentation

## 2014-11-02 LAB — HEPATIC FUNCTION PANEL
ALT: 11 U/L (ref 0–35)
AST: 15 U/L (ref 0–37)
Albumin: 4.3 g/dL (ref 3.5–5.2)
Alkaline Phosphatase: 36 U/L — ABNORMAL LOW (ref 39–117)
BILIRUBIN DIRECT: 0.1 mg/dL (ref 0.0–0.3)
BILIRUBIN INDIRECT: 0.3 mg/dL (ref 0.2–1.2)
Total Bilirubin: 0.4 mg/dL (ref 0.2–1.2)
Total Protein: 7.3 g/dL (ref 6.0–8.3)

## 2014-11-02 LAB — CBC WITH DIFFERENTIAL/PLATELET
BASOS ABS: 0.1 10*3/uL (ref 0.0–0.1)
BASOS PCT: 1 % (ref 0–1)
EOS ABS: 0.2 10*3/uL (ref 0.0–0.7)
EOS PCT: 3 % (ref 0–5)
HCT: 43.3 % (ref 36.0–46.0)
Hemoglobin: 14.8 g/dL (ref 12.0–15.0)
LYMPHS PCT: 28 % (ref 12–46)
Lymphs Abs: 1.8 10*3/uL (ref 0.7–4.0)
MCH: 30.9 pg (ref 26.0–34.0)
MCHC: 34.2 g/dL (ref 30.0–36.0)
MCV: 90.4 fL (ref 78.0–100.0)
Monocytes Absolute: 0.4 10*3/uL (ref 0.1–1.0)
Monocytes Relative: 6 % (ref 3–12)
Neutro Abs: 4.1 10*3/uL (ref 1.7–7.7)
Neutrophils Relative %: 62 % (ref 43–77)
PLATELETS: 353 10*3/uL (ref 150–400)
RBC: 4.79 MIL/uL (ref 3.87–5.11)
RDW: 13.4 % (ref 11.5–15.5)
WBC: 6.6 10*3/uL (ref 4.0–10.5)

## 2014-11-02 LAB — BASIC METABOLIC PANEL
BUN: 16 mg/dL (ref 6–23)
CO2: 29 mEq/L (ref 19–32)
Calcium: 9.4 mg/dL (ref 8.4–10.5)
Chloride: 103 mEq/L (ref 96–112)
Creat: 0.77 mg/dL (ref 0.50–1.10)
GLUCOSE: 88 mg/dL (ref 70–99)
POTASSIUM: 4.2 meq/L (ref 3.5–5.3)
Sodium: 139 mEq/L (ref 135–145)

## 2014-11-02 LAB — LIPID PANEL
CHOLESTEROL: 193 mg/dL (ref 0–200)
HDL: 55 mg/dL (ref >39)
LDL Cholesterol: 118 mg/dL — ABNORMAL HIGH (ref 0–99)
Total CHOL/HDL Ratio: 3.5 Ratio
Triglycerides: 99 mg/dL (ref <150)
VLDL: 20 mg/dL (ref 0–40)

## 2014-11-02 LAB — TSH: TSH: 3.955 u[IU]/mL (ref 0.350–4.500)

## 2014-11-02 LAB — VITAMIN B12: VITAMIN B 12: 376 pg/mL (ref 211–911)

## 2014-11-02 LAB — VITAMIN D 25 HYDROXY (VIT D DEFICIENCY, FRACTURES): Vit D, 25-Hydroxy: 24 ng/mL — ABNORMAL LOW (ref 30–89)

## 2014-11-02 NOTE — Progress Notes (Signed)
Subjective:  Presents for routine follow-up. Has not had her lab work.depression anxiety much improved. Patient may cut back on her Celexa. Has moved out and ended her relationship with her partner. 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  Objective:   BP 128/80 mmHg  Ht 5\' 1"  (1.549 m)  Wt 163 lb (73.936 kg)  BMI 30.81 kg/m2 NAD. Alert, oriented. Cheerful affect. Lungs clear. Heart regular rate rhythm.  Assessment:  Problem List Items Addressed This Visit      Endocrine   Other specified hypothyroidism - Primary   Relevant Medications      levothyroxine (SYNTHROID, LEVOTHROID) tablet     Other   Depression with anxiety     Plan:  Meds ordered this encounter  Medications  . levothyroxine (SYNTHROID, LEVOTHROID) 25 MCG tablet    Sig: TAKE (1) TABLET BY MOUTH DAILY BEFORE BREAKFAST.    Dispense:  30 tablet    Refill:  11    Order Specific Question:  Supervising Provider    Answer:  Merlyn AlbertLUKING, WILLIAM S [[id number]]  . citalopram (CELEXA) 20 MG tablet    Sig: 1 1/2 tabs po qd    Dispense:  45 tablet    Refill:  11    Order Specific Question:  Supervising Provider    Answer:  Merlyn AlbertLUKING, WILLIAM S [[id number]]    Continue current meds as directed. May cut back on Celexa over time if no issues. There are no issues at this point that would prohibit patient from obtaining her CDL. Strongly encouraged her to get blood work done in the near future.  because of her upcoming school schedule, follow-up as needed.

## 2014-11-06 ENCOUNTER — Encounter: Payer: Self-pay | Admitting: Nurse Practitioner

## 2014-11-07 ENCOUNTER — Telehealth: Payer: Self-pay | Admitting: Nurse Practitioner

## 2014-11-07 ENCOUNTER — Encounter: Payer: Self-pay | Admitting: Nurse Practitioner

## 2014-11-07 NOTE — Telephone Encounter (Signed)
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  She said that she spoke with Eber Jonesarolyn about this previously at her last visit.  She said this is urgent and needs this either Thursday or Friday.

## 2014-11-08 ENCOUNTER — Encounter: Payer: Self-pay | Admitting: Family Medicine

## 2014-11-09 ENCOUNTER — Encounter: Payer: Self-pay | Admitting: Nurse Practitioner

## 2014-11-09 NOTE — Telephone Encounter (Signed)
done

## 2014-11-20 ENCOUNTER — Telehealth: Payer: Self-pay | Admitting: Family Medicine

## 2014-11-20 NOTE — Telephone Encounter (Signed)
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  Eber JonesCarolyn wrote her a letter explaining the reason for the depression medication and they understand that she is on 1.5 tablets daily.  She just wanted to let Eber JonesCarolyn know that she has already brought herself down to taking .5 tablets just in case they call and speak to [place] about this.

## 2014-11-20 NOTE — Telephone Encounter (Signed)
Noted  

## 2014-11-21 ENCOUNTER — Telehealth: Payer: Self-pay | Admitting: Family Medicine

## 2014-11-21 NOTE — Telephone Encounter (Signed)
Pt is currently in [place] on a job interview, there is a form we need to complete concerning her Celexa.  Please see form on top of paper chart, please complete and give to Brendale to be faxed in

## 2014-11-22 NOTE — Telephone Encounter (Signed)
Erica sent OV note & I faxed completed form

## 2014-11-22 NOTE — Telephone Encounter (Signed)
Done, needs copy ov note fr 11 11 15

## 2014-11-22 NOTE — Telephone Encounter (Signed)
Jill James, can you print copy of office note along with the form?

## 2015-05-10 ENCOUNTER — Telehealth: Payer: Self-pay | Admitting: Nurse Practitioner

## 2015-05-10 ENCOUNTER — Other Ambulatory Visit: Payer: Self-pay | Admitting: Family Medicine

## 2015-05-10 DIAGNOSIS — Z1231 Encounter for screening mammogram for malignant neoplasm of breast: Secondary | ICD-10-CM

## 2015-05-10 NOTE — Telephone Encounter (Signed)
Mammogram schedule [facility] June 13th register 8:15 am. Pt notified.

## 2015-05-10 NOTE — Telephone Encounter (Signed)
Patient said that due to her age, she would like to have a mammogram done while she is in town.  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 She has an appointment with our office on June 15th and she wanted to make sure she had ample time to make sure she could get this scheduled while she is in town. Please advise.

## 2015-06-04 ENCOUNTER — Ambulatory Visit (HOSPITAL_COMMUNITY)
Admission: RE | Admit: 2015-06-04 | Discharge: 2015-06-04 | Disposition: A | Discharge status: Home / Self Care | Payer: BLUE CROSS/BLUE SHIELD | Source: Ambulatory Visit | Attending: Family Medicine | Admitting: Family Medicine

## 2015-06-04 DIAGNOSIS — Z1231 Encounter for screening mammogram for malignant neoplasm of breast: Secondary | ICD-10-CM | POA: Diagnosis present

## 2015-06-06 ENCOUNTER — Ambulatory Visit (INDEPENDENT_AMBULATORY_CARE_PROVIDER_SITE_OTHER): Payer: BLUE CROSS/BLUE SHIELD | Admitting: Nurse Practitioner

## 2015-06-06 ENCOUNTER — Telehealth: Payer: Self-pay | Admitting: Family Medicine

## 2015-06-06 ENCOUNTER — Encounter: Payer: Self-pay | Admitting: Nurse Practitioner

## 2015-06-06 ENCOUNTER — Other Ambulatory Visit: Payer: Self-pay | Admitting: Family Medicine

## 2015-06-06 VITALS — BP 124/76 | Ht 61.0 in | Wt 167.0 lb

## 2015-06-06 DIAGNOSIS — E039 Hypothyroidism, unspecified: Secondary | ICD-10-CM | POA: Diagnosis not present

## 2015-06-06 DIAGNOSIS — F5109 Other insomnia not due to a substance or known physiological condition: Secondary | ICD-10-CM | POA: Diagnosis not present

## 2015-06-06 DIAGNOSIS — R928 Other abnormal and inconclusive findings on diagnostic imaging of breast: Secondary | ICD-10-CM

## 2015-06-06 MED ORDER — PHENTERMINE HCL 37.5 MG PO TABS: 37.5000 mg | ORAL_TABLET | Freq: Every day | ORAL | Status: DC

## 2015-06-06 MED ORDER — ALPRAZOLAM 0.5 MG PO TABS: 0.5000 mg | ORAL_TABLET | Freq: Every evening | ORAL | Status: DC | PRN

## 2015-06-06 MED ORDER — ALPRAZOLAM 0.5 MG PO TBDP: 0.5000 mg | ORAL_TABLET | Freq: Every evening | ORAL | Status: DC | PRN

## 2015-06-06 NOTE — Telephone Encounter (Signed)
Printed one rx; printed wrong type dissolvable; usually write refills on one Rx instead of 2 Rx; please clarify

## 2015-06-06 NOTE — Telephone Encounter (Signed)
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 So whatever state she is in at the time she can take the script to a local pharmacy to have it filled.

## 2015-06-06 NOTE — Telephone Encounter (Signed)
Patient needs 2 written prescriptions for refills on niravam 0.5 mg to carry with her on the road so she can get them filled. She seen Jill James today and forgot to ask at appointment.

## 2015-06-06 NOTE — Telephone Encounter (Signed)
Per Eber Jones- Prescriptions are printed and ready for pickup and patient has to have these filled at a [facility]. Left message on voicemail notifying patient.

## 2015-06-07 ENCOUNTER — Other Ambulatory Visit: Payer: Self-pay | Admitting: Nurse Practitioner

## 2015-06-07 ENCOUNTER — Encounter: Payer: Self-pay | Admitting: Nurse Practitioner

## 2015-06-07 ENCOUNTER — Other Ambulatory Visit: Payer: Self-pay | Admitting: Family Medicine

## 2015-06-07 DIAGNOSIS — F5109 Other insomnia not due to a substance or known physiological condition: Secondary | ICD-10-CM | POA: Insufficient documentation

## 2015-06-07 DIAGNOSIS — R928 Other abnormal and inconclusive findings on diagnostic imaging of breast: Secondary | ICD-10-CM

## 2015-06-07 DIAGNOSIS — E039 Hypothyroidism, unspecified: Secondary | ICD-10-CM

## 2015-06-07 LAB — TSH: TSH: 5.5 u[IU]/mL — AB (ref 0.450–4.500)

## 2015-06-07 MED ORDER — LEVOTHYROXINE SODIUM 50 MCG PO TABS: 50.0000 ug | ORAL_TABLET | Freq: Every day | ORAL | Status: DC

## 2015-06-07 NOTE — Progress Notes (Signed)
Subjective:   Presents for routine follow-up.  Due for a checkup on her thyroid. Has stopped her citalopram, in a good relationship and has started a job that she is enjoying. 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 No difficulty when she is at home. Would like to restart phentermine to help her with weight loss. Has taken this without difficulty in the past.  Objective:   BP 124/76 mmHg  Ht 5\' 1"  (1.549 m)  Wt 167 lb (75.751 kg)  BMI 31.57 kg/m2  NAD. Alert, oriented. Cheerful affect. Lungs clear. Heart regular rate rhythm.  Assessment:  Problem List Items Addressed This Visit      Endocrine   Hypothyroidism - Primary   Relevant Orders   TSH (Completed)     Other   Morbid obesity   Relevant Medications   phentermine (ADIPEX-P) 37.5 MG tablet   Other Relevant Orders   TSH (Completed)   Situational insomnia     Plan:  Meds ordered this encounter  Medications  . DISCONTD: phentermine (ADIPEX-P) 37.5 MG tablet    Sig: Take 1 tablet (37.5 mg total) by mouth daily before breakfast.    Dispense:  30 tablet    Refill:  2    Order Specific Question:  Supervising Provider    Answer:  Merlyn Albert [[id number]]  . DISCONTD: ALPRAZolam (NIRAVAM) 0.5 MG dissolvable tablet    Sig: Take 1 tablet (0.5 mg total) by mouth at bedtime as needed. For sleep    Dispense:  30 tablet    Refill:  0    Order Specific Question:  Supervising Provider    Answer:  Merlyn Albert [[id number]]  . ALPRAZolam (XANAX) 0.5 MG tablet    Sig: Take 1 tablet (0.5 mg total) by mouth at bedtime as needed for anxiety or sleep.    Dispense:  30 tablet    Refill:  0    Order Specific Question:  Supervising Provider    Answer:  Merlyn Albert [[id number]]  . DISCONTD: phentermine (ADIPEX-P) 37.5 MG tablet    Sig: Take 1 tablet (37.5 mg total) by mouth daily before breakfast.    Dispense:  30 tablet    Refill:  0    [facility]    Order Specific Question:  Supervising Provider    Answer:  Merlyn Albert [[id number]]  . phentermine (ADIPEX-P) 37.5 MG tablet    Sig: Take 1 tablet (37.5 mg total) by mouth daily before breakfast.    Dispense:  30 tablet    Refill:  0    [facility] fill [facility] days from [facility]06/15; [facility]    Order Specific Question:  Supervising Provider    Answer:  Merlyn Albert [[id number]]    first prescription of Xanax was ordered in error , needs regular by mouth not dissolvable. Given 3 separate monthly prescriptions for phentermine. Encouraged regular activity and healthy diet. Use Xanax only for sleep , never use it when she is driving or around equipment /machinery. Return in about 3 months (around 09/06/2015) for recheck.  Due for her wellness exam this summer.

## 2015-06-08 ENCOUNTER — Ambulatory Visit
Admission: RE | Admit: 2015-06-08 | Discharge: 2015-06-08 | Disposition: A | Discharge status: Home / Self Care | Payer: BLUE CROSS/BLUE SHIELD | Source: Ambulatory Visit | Attending: Family Medicine | Admitting: Family Medicine

## 2015-06-08 DIAGNOSIS — R928 Other abnormal and inconclusive findings on diagnostic imaging of breast: Secondary | ICD-10-CM

## 2015-09-06 ENCOUNTER — Ambulatory Visit (INDEPENDENT_AMBULATORY_CARE_PROVIDER_SITE_OTHER): Payer: BLUE CROSS/BLUE SHIELD | Admitting: Family Medicine

## 2015-09-06 ENCOUNTER — Encounter: Payer: Self-pay | Admitting: Family Medicine

## 2015-09-06 VITALS — BP 122/78 | Ht 61.0 in | Wt 155.0 lb

## 2015-09-06 DIAGNOSIS — E039 Hypothyroidism, unspecified: Secondary | ICD-10-CM

## 2015-09-06 DIAGNOSIS — D649 Anemia, unspecified: Secondary | ICD-10-CM

## 2015-09-06 DIAGNOSIS — Z87440 Personal history of urinary (tract) infections: Secondary | ICD-10-CM

## 2015-09-06 LAB — POCT URINALYSIS DIPSTICK: pH, UA: 7

## 2015-09-06 LAB — POCT HEMOGLOBIN: Hemoglobin: 13.9 g/dL (ref 12.2–16.2)

## 2015-09-06 NOTE — Progress Notes (Signed)
   Subjective:    Patient ID: Jill James, female    DOB: 1976/05/17, 39 y.o.   MRN: [id number]  HPI Patient is here today for a hospital follow up visit. Patient was admitted into a hospital in [place] about 1 1/2 weeks ago. Patient was experiencing fever, chills and body aches. Patient was diagnosed with gram negative bacteria in her bloodstream from a previous urinary tract infection.   sporadic uti's  E coli and sepsis  achey fever dim energy   Shaking chills  Burning up felt very hot  Bad joint pain and achey  Frequent urineary trips at first  Felt bad and admitted, two night s in the hosp, Patient is doing much better now.   Patient has no other concerns at this time.   Results for orders placed or performed in visit on 09/06/15  POCT hemoglobin  Result Value Ref Range   Hemoglobin 13.9 12.2 - 16.2 g/dL  POCT urinalysis dipstick  Result Value Ref Range   Color, UA     Clarity, UA     Glucose, UA     Bilirubin, UA     Ketones, UA     Spec Grav, UA <=1.005    Blood, UA     pH, UA 7.0    Protein, UA     Urobilinogen, UA     Nitrite, UA     Leukocytes, UA  Negative    Review of Systems Residual fatigue no headache no chest pain no back pain abdominal pain no change in bowel habits    Objective:   Physical Exam Alert vital stable HET normal neck supple. Lungs clear heart rare rhythm no CVA tenderness abdomen benign.  Complete hospital records reviewed. This was indeed gram-negative sepsis. Likely urinary as etiology       Assessment & Plan:  Impression status post urinary sepsis much improved. Full hospital record reviewed. 25 minutes spent most in review and discussion. Warning signs discussed WSL

## 2015-09-07 LAB — TSH: TSH: 1.77 u[IU]/mL (ref 0.450–4.500)

## 2015-10-10 ENCOUNTER — Telehealth: Payer: Self-pay | Admitting: Family Medicine

## 2015-10-10 MED ORDER — LEVOTHYROXINE SODIUM 50 MCG PO TABS: 50.0000 ug | ORAL_TABLET | Freq: Every day | ORAL | Status: DC

## 2015-10-10 NOTE — Telephone Encounter (Signed)
Pt is needing a refill on her [facility]othyroxine [facility]

## 2015-10-10 NOTE — Telephone Encounter (Signed)
Left message informing patient requested medication was sent into pharmacy.

## 2015-10-17 IMAGING — MG MM DIGITAL SCREENING BILAT W/ CAD
5 series · 5 of 5 positions shown · non-contrast
Comparison: None.

CLINICAL DATA: Screening. Baseline exam.

EXAM:
DIGITAL SCREENING BILATERAL MAMMOGRAM WITH CAD

[L CC]
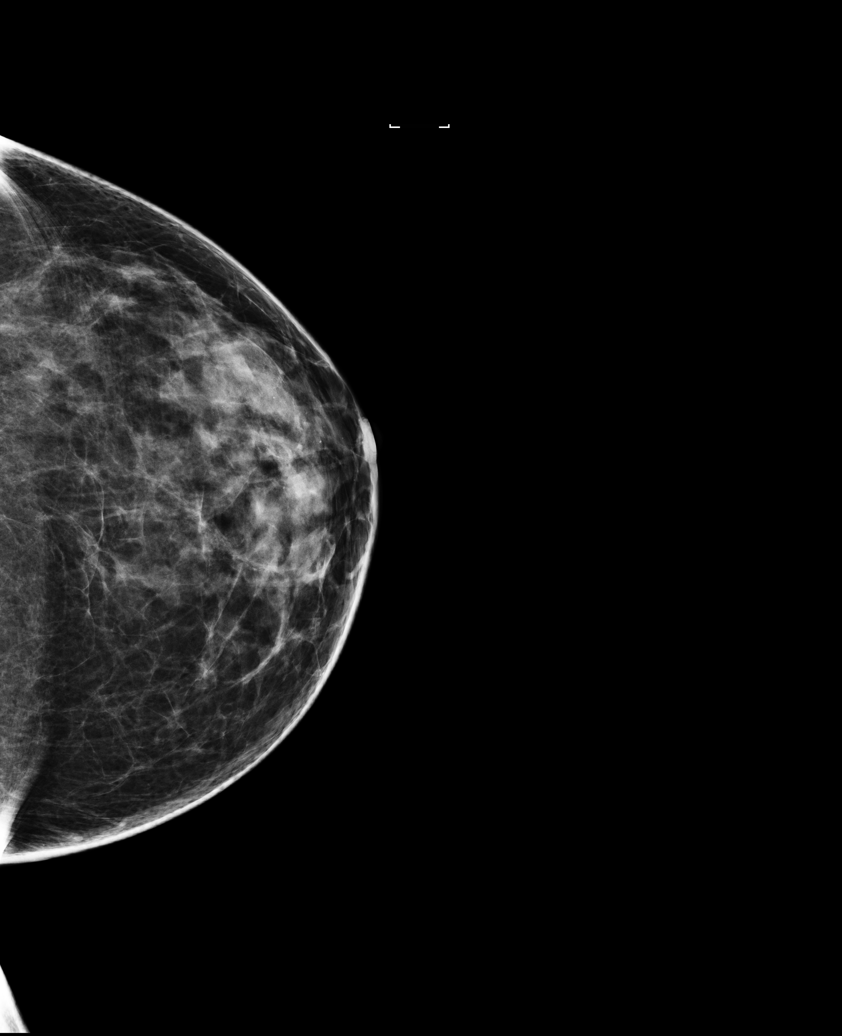

[L MLO (1 of 2)]
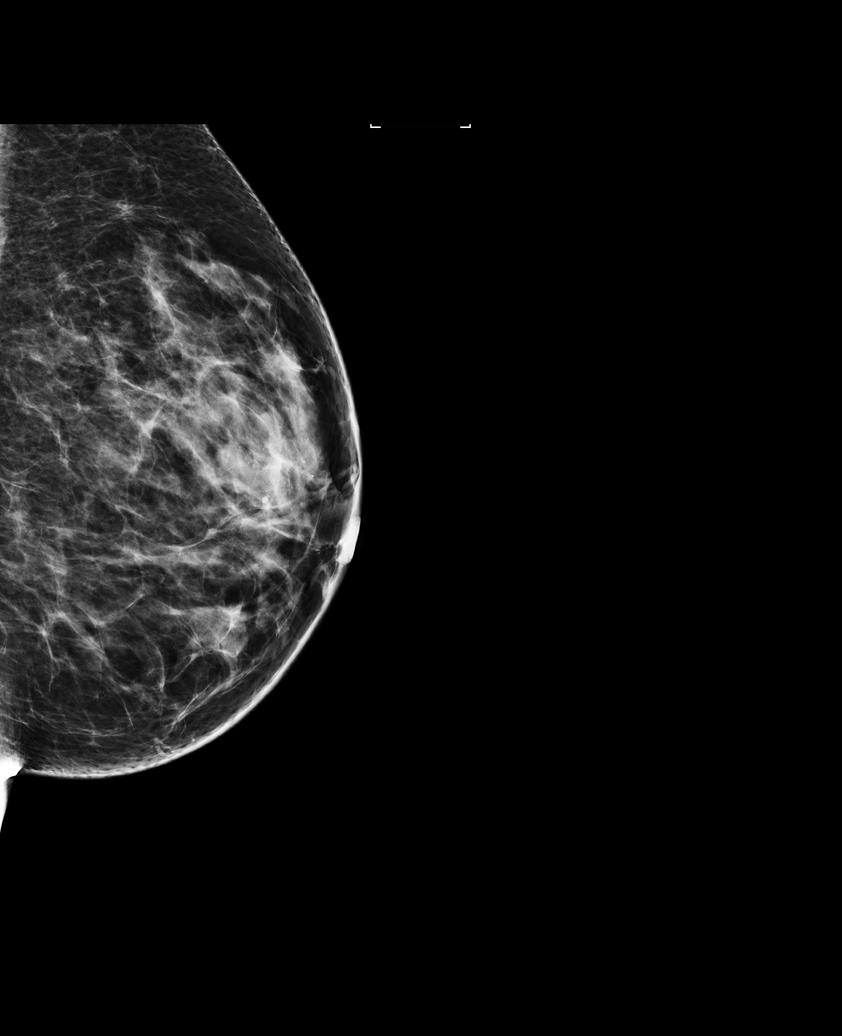

[R MLO]
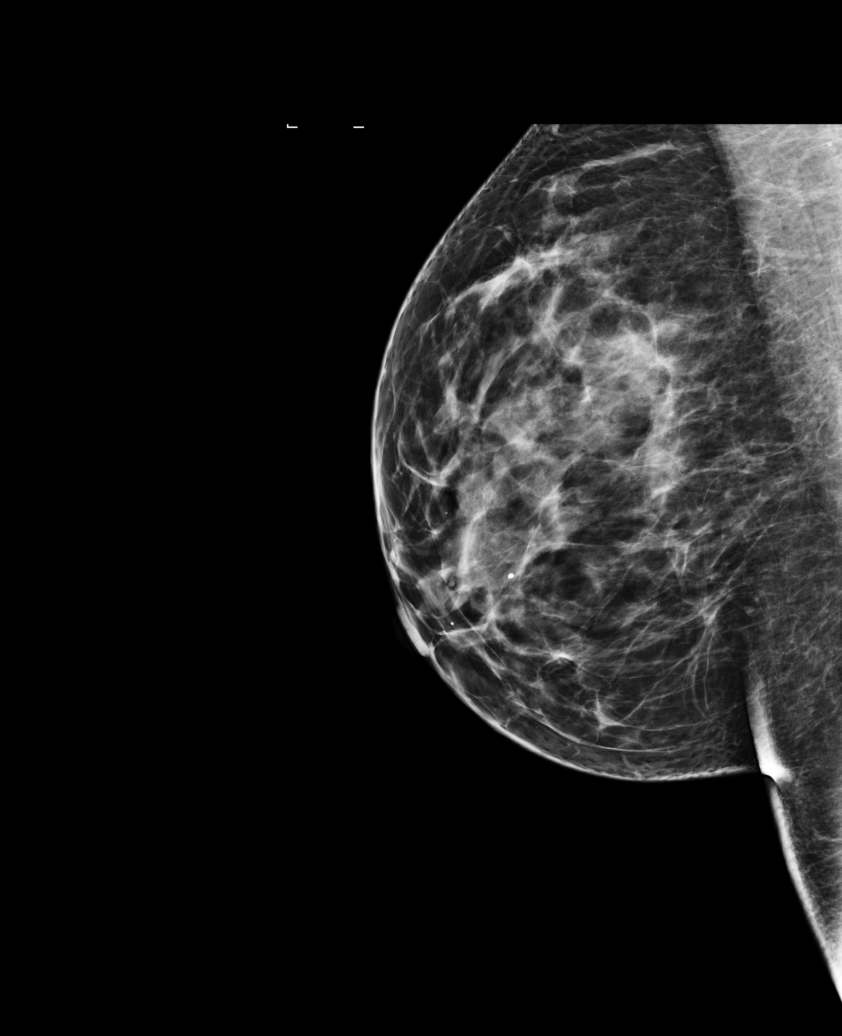

[L MLO (2 of 2)]
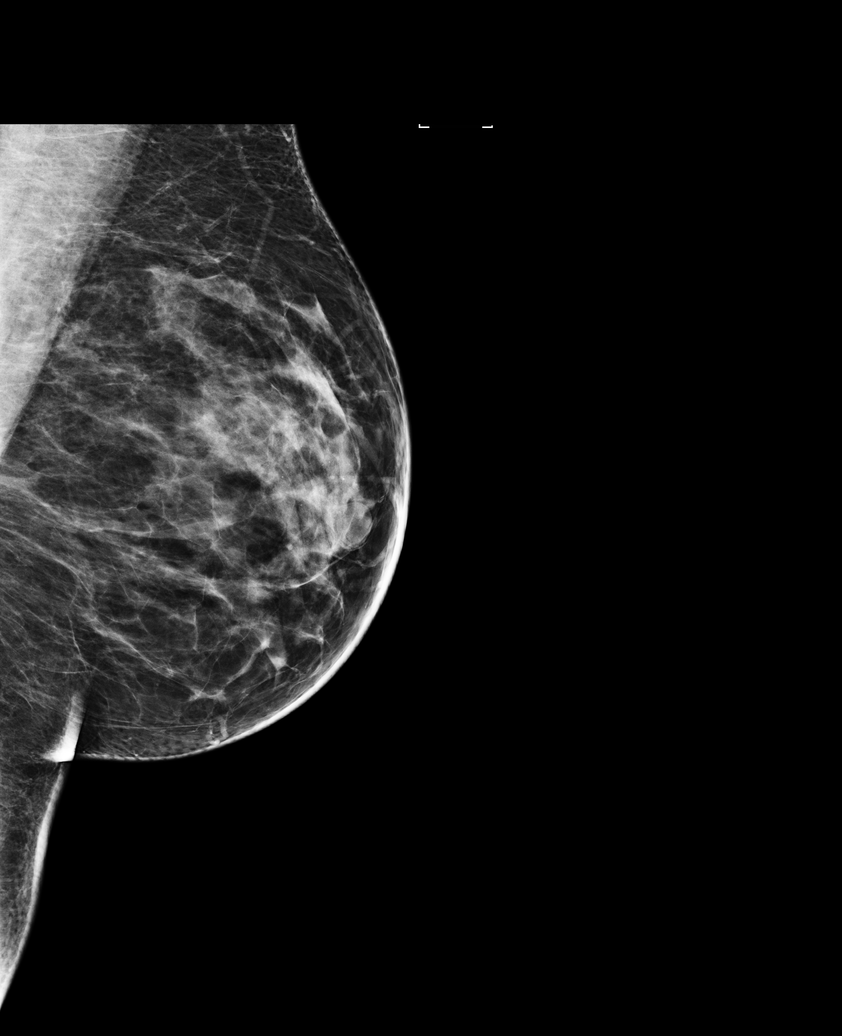

[R CC]
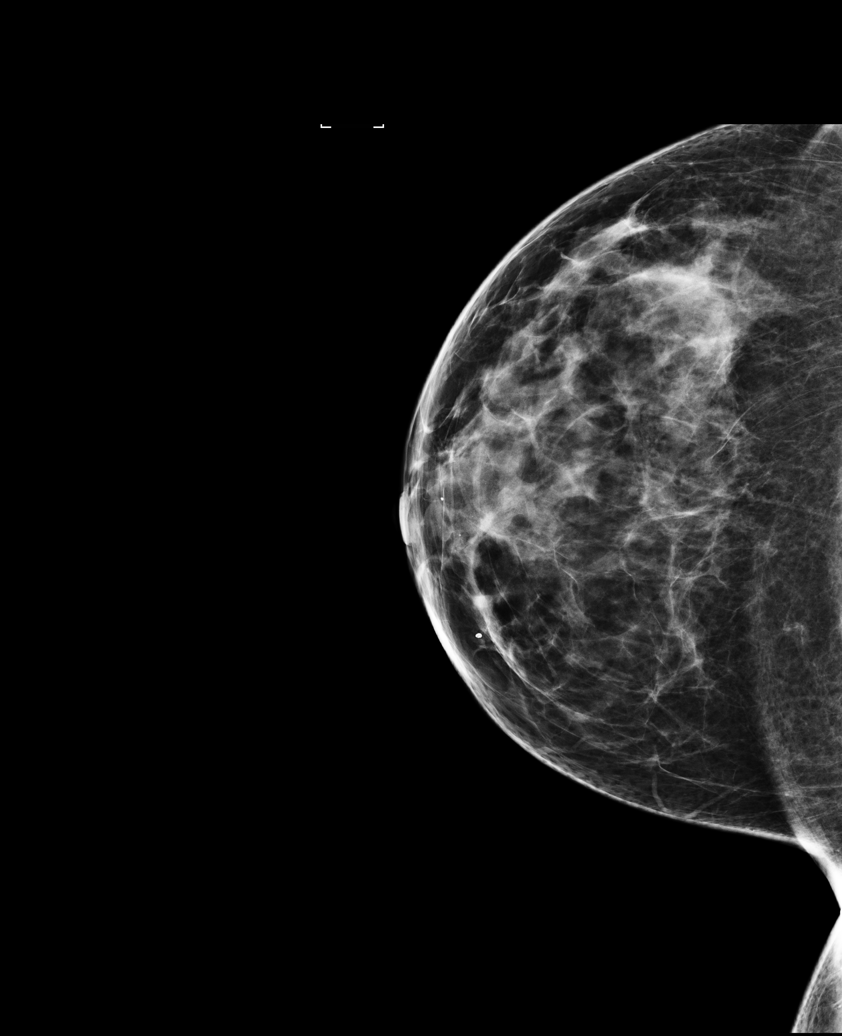

[5 of 5 positions shown; findings below may reference images not displayed]

ACR Breast Density Category c: The breast tissue is heterogeneously
dense, which may obscure small masses.
FINDINGS: In the left breast, a possible asymmetry warrants further
evaluation. In the right breast, no findings suspicious for
malignancy. Images were processed with CAD.
IMPRESSION: Further evaluation is suggested for possible asymmetry in the left
breast.

RECOMMENDATION:
Diagnostic mammogram and possibly ultrasound of the left breast.
(Code:[id number])

The patient will be contacted regarding the findings, and additional
imaging will be scheduled.

BI-RADS CATEGORY  0: Incomplete. Need additional imaging evaluation
and/or prior mammograms for comparison.

## 2016-05-01 ENCOUNTER — Telehealth: Payer: Self-pay | Admitting: Family Medicine

## 2016-05-01 MED ORDER — LEVOTHYROXINE SODIUM 50 MCG PO TABS: 50.0000 ug | ORAL_TABLET | Freq: Every day | ORAL | Status: DC

## 2016-05-01 NOTE — Telephone Encounter (Signed)
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 States she  Tried to refill it there but they were hateful about it and said she needed to  Reach us to submit it.   [facility] [place]  [phone] phone #

## 2016-05-01 NOTE — Telephone Encounter (Signed)
Rx sent electronically to pharmacy. Patient notified. 

## 2016-05-22 ENCOUNTER — Telehealth: Payer: Self-pay | Admitting: Family Medicine

## 2016-05-22 ENCOUNTER — Other Ambulatory Visit: Payer: Self-pay | Admitting: Nurse Practitioner

## 2016-05-22 ENCOUNTER — Encounter: Payer: Self-pay | Admitting: Nurse Practitioner

## 2016-05-22 MED ORDER — CEFPROZIL 500 MG PO TABS: ORAL_TABLET | ORAL | Status: DC

## 2016-05-22 NOTE — Telephone Encounter (Signed)
Spoke with patient to discuss symptoms. Patient stated that she is experiencing burning, and tingling on urination, with urinary frequency,. Onset 48 hours ago. Denies fever, abdominal pain, and lower back pain. States tried cranberry juice/ water. Please advise.

## 2016-05-22 NOTE — Telephone Encounter (Signed)
Pt called from [place] requesting an antibiotic to be called in for a UTI. I informed the pt that she would need to be seen for an antibiotic to be prescribed. I also told the patient that she may need to go to er or urgent care. Pt insisted I send a note back.

## 2016-05-22 NOTE — Telephone Encounter (Signed)
Spoke with patient and informed her per Dr.Scott Luking- Cefzil 500 mg 1 twice a day for 7 days. She can use over-the-counter Azo-Standard to help take away the discomfort also please inform patient know that if she starts developing severe pain in the lower abdomen or flanks or high fevers this could be a sign that she needs to go to urgent care or ER typically with antibiotic she should see significant improvement over the next 48 hours please call or follow-up if problems. Patient verbalized understanding. Medication sent into pharmacy.

## 2016-05-22 NOTE — Telephone Encounter (Signed)
In this situation it is perfectly fine to go ahead and send in the antibiotic Cefzil 500 mg 1 twice a day for 7 days. She can use over-the-counter Azo-Standard to help take away the discomfort also please inform patient know that if she starts developing severe pain in the lower abdomen or flanks or high fevers this could be a sign that she needs to go to urgent care or ER typically with antibiotic she should see significant improvement over the next 48 hours please call or follow-up if problems

## 2016-06-19 ENCOUNTER — Telehealth: Payer: Self-pay | Admitting: Family Medicine

## 2016-06-19 MED ORDER — PREDNISONE 20 MG PO TABS: ORAL_TABLET | ORAL | Status: DC

## 2016-06-19 NOTE — Telephone Encounter (Signed)
Patient requesting prescription for prednisone called in for poison oak on eyelid and arms to [facility].

## 2016-06-19 NOTE — Telephone Encounter (Signed)
Adult pred taper 

## 2016-06-19 NOTE — Telephone Encounter (Signed)
Spoke with patient and informed him per Dr.Steve Luking- We are sending in Adult Prednisone Taper to [facility]. Patient verbalized understanding.

## 2016-08-11 ENCOUNTER — Telehealth: Payer: Self-pay | Admitting: Family Medicine

## 2016-08-12 MED ORDER — LEVOTHYROXINE SODIUM 50 MCG PO TABS: 50.0000 ug | ORAL_TABLET | Freq: Every day | ORAL | 0 refills | Status: DC

## 2016-08-12 NOTE — Telephone Encounter (Signed)
Patient requesting a refill on Phentermine.

## 2016-08-14 ENCOUNTER — Other Ambulatory Visit: Payer: Self-pay | Admitting: Nurse Practitioner

## 2016-08-14 NOTE — Telephone Encounter (Signed)
Left message return call 08/14/16 

## 2016-08-14 NOTE — Telephone Encounter (Signed)
It has been a year since last office visit. We will need to see her to restart med. Thanks.

## 2016-08-18 NOTE — Telephone Encounter (Signed)
Patient advised that It has been a year since last office visit. We will need to see her to restart med. Patient verbalized understanding and stated she will call back and schedule office visit when her insurance becomes effective in October.

## 2016-10-02 ENCOUNTER — Ambulatory Visit (INDEPENDENT_AMBULATORY_CARE_PROVIDER_SITE_OTHER): Payer: BLUE CROSS/BLUE SHIELD | Admitting: Nurse Practitioner

## 2016-10-02 ENCOUNTER — Encounter: Payer: Self-pay | Admitting: Nurse Practitioner

## 2016-10-02 VITALS — BP 126/82 | Ht 61.0 in | Wt 171.6 lb

## 2016-10-02 DIAGNOSIS — E039 Hypothyroidism, unspecified: Secondary | ICD-10-CM | POA: Diagnosis not present

## 2016-10-02 DIAGNOSIS — F5109 Other insomnia not due to a substance or known physiological condition: Secondary | ICD-10-CM

## 2016-10-02 MED ORDER — PHENTERMINE HCL 37.5 MG PO TABS: 37.5000 mg | ORAL_TABLET | Freq: Every day | ORAL | 2 refills | Status: DC

## 2016-10-03 ENCOUNTER — Encounter: Payer: Self-pay | Admitting: Nurse Practitioner

## 2016-10-03 LAB — TSH: TSH: 5.22 u[IU]/mL — ABNORMAL HIGH (ref 0.450–4.500)

## 2016-10-03 NOTE — Progress Notes (Signed)
Subjective:  Presents for routine follow-up. Is due for her thyroid lab work. Would like to restart phentermine for weight loss. 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 She is working extended hours away from home. Very interrupted sleep cycle. Has been experiencing an increase in her fatigue emotional lability.  Objective:   BP 126/82   Ht 5\' 1"  (1.549 m)   Wt 171 lb 9.6 oz (77.8 kg)   BMI 32.42 kg/m  NAD. Alert, oriented. Thyroid nontender to palpation, no masses were noted. Lungs clear. Heart regular rate rhythm.  Assessment: Problem List Items Addressed This Visit      Endocrine   Hypothyroidism - Primary   Relevant Orders   TSH     Other   Morbid obesity (HCC)   Relevant Medications   phentermine (ADIPEX-P) 37.5 MG tablet   Situational insomnia    Other Visit Diagnoses   None.    Plan:  Meds ordered this encounter  Medications  . phentermine (ADIPEX-P) 37.5 MG tablet    Sig: Take 1 tablet (37.5 mg total) by mouth daily before breakfast.    Dispense:  30 tablet    Refill:  2    Order Specific Question:   Supervising Provider    Answer:   Merlyn AlbertLUKING, WILLIAM S [[id number]]   Discussed options. Restart phentermine for weight loss. Unfortunately the situation she is in cannot change and a constant break up in her sleep cycle is only going worsen her symptoms. Patient to consider restarting a daily medication for anxiety and depression. Call back if symptoms worsen or persist. Recheck in 3 months if she wishes to continue phentermine.

## 2016-11-06 ENCOUNTER — Other Ambulatory Visit: Payer: Self-pay | Admitting: Family Medicine

## 2016-12-26 ENCOUNTER — Encounter: Payer: Self-pay | Admitting: Nurse Practitioner

## 2017-01-08 ENCOUNTER — Encounter: Payer: Self-pay | Admitting: Nurse Practitioner

## 2017-01-09 ENCOUNTER — Other Ambulatory Visit: Payer: Self-pay | Admitting: Nurse Practitioner

## 2017-01-09 MED ORDER — AMOXICILLIN-POT CLAVULANATE 875-125 MG PO TABS: 1.0000 | ORAL_TABLET | Freq: Two times a day (BID) | ORAL | 0 refills | Status: DC

## 2017-01-23 ENCOUNTER — Encounter: Payer: Self-pay | Admitting: Family Medicine

## 2017-01-28 ENCOUNTER — Telehealth: Payer: Self-pay | Admitting: Family Medicine

## 2017-01-28 NOTE — Telephone Encounter (Signed)
Patient called in regarding a bill.  Also was asking about yearly screening mammogram.  Patient is in [place] for the next 8-9 months and wanted to know how that works.  I told her to check around the area to see where she could have a mammo done and see if they have any special requirements. [place] does not require a doctor's order but I wasn't sure about a different state.  She may just wait.  I gave her the fax# for them to send us the results if she has one done.  If any problems she will call us back.

## 2017-02-02 ENCOUNTER — Encounter: Payer: Self-pay | Admitting: Nurse Practitioner

## 2017-02-05 ENCOUNTER — Ambulatory Visit: Payer: BLUE CROSS/BLUE SHIELD | Admitting: Nurse Practitioner

## 2017-02-06 ENCOUNTER — Encounter: Payer: Self-pay | Admitting: Family Medicine

## 2017-02-17 ENCOUNTER — Encounter: Payer: Self-pay | Admitting: Nurse Practitioner

## 2017-02-18 ENCOUNTER — Other Ambulatory Visit: Payer: Self-pay | Admitting: Nurse Practitioner

## 2017-02-18 MED ORDER — LEVOTHYROXINE SODIUM 50 MCG PO TABS: 50.0000 ug | ORAL_TABLET | Freq: Every day | ORAL | 1 refills | Status: DC

## 2017-04-13 ENCOUNTER — Telehealth: Payer: Self-pay | Admitting: Nurse Practitioner

## 2017-04-13 NOTE — Telephone Encounter (Signed)
Patient wants to know if she is due for blood work for her appointment on 07/03/17 with Eber Jones?  Also, she would like mammogram ordered for 07/03/17.

## 2017-04-13 NOTE — Telephone Encounter (Signed)
Tried to call mailbox is full.

## 2017-04-13 NOTE — Telephone Encounter (Signed)
Yes. She is due for regular labs (last done 2015) including lipid, liver, met 7. Vitamin D (vitamin D insufficiency) and TSH (hypothyroidism). Also please schedule mammogram. Thanks.

## 2017-04-14 ENCOUNTER — Other Ambulatory Visit: Payer: Self-pay | Admitting: *Deleted

## 2017-04-14 ENCOUNTER — Other Ambulatory Visit: Payer: Self-pay | Admitting: Family Medicine

## 2017-04-14 DIAGNOSIS — Z1231 Encounter for screening mammogram for malignant neoplasm of breast: Secondary | ICD-10-CM

## 2017-04-14 DIAGNOSIS — Z79899 Other long term (current) drug therapy: Secondary | ICD-10-CM

## 2017-04-14 DIAGNOSIS — E559 Vitamin D deficiency, unspecified: Secondary | ICD-10-CM

## 2017-04-14 DIAGNOSIS — E785 Hyperlipidemia, unspecified: Secondary | ICD-10-CM

## 2017-04-14 DIAGNOSIS — E039 Hypothyroidism, unspecified: Secondary | ICD-10-CM

## 2017-04-14 DIAGNOSIS — Z1239 Encounter for other screening for malignant neoplasm of breast: Secondary | ICD-10-CM

## 2017-04-14 DIAGNOSIS — I10 Essential (primary) hypertension: Secondary | ICD-10-CM

## 2017-04-14 NOTE — Telephone Encounter (Signed)
Left message to return call to let pt know bw orders are put in and mammo scheduled aph July 13th 12pm register 11:50 am.

## 2017-04-17 NOTE — Telephone Encounter (Signed)
Pt.notified

## 2017-04-20 ENCOUNTER — Encounter: Payer: Self-pay | Admitting: Nurse Practitioner

## 2017-06-08 ENCOUNTER — Telehealth: Payer: BLUE CROSS/BLUE SHIELD | Admitting: Nurse Practitioner

## 2017-06-08 DIAGNOSIS — R1110 Vomiting, unspecified: Secondary | ICD-10-CM

## 2017-06-08 DIAGNOSIS — R197 Diarrhea, unspecified: Secondary | ICD-10-CM

## 2017-06-08 DIAGNOSIS — R109 Unspecified abdominal pain: Secondary | ICD-10-CM

## 2017-06-08 NOTE — Progress Notes (Signed)
Based on what you shared with me it looks like you have a serious condition that should be evaluated in a face to face office visit.  NOTE: Even if you have entered your credit card information for this eVisit, you will not be charged.   If you are having a true medical emergency please call 911.  If you need an urgent face to face visit, [facility] has four urgent care centers for your convenience.  If you need care fast and have a high deductible or no insurance consider:   [email or web address]  [phone]  [address], [address] [place], [place] [address] 8 am to 8 pm Monday-Friday 10 am to 4 pm Saturday-Sunday   The following sites will take your  insurance:    . [facility]  [phone] Get Driving Directions Find a Provider at this Location  [address] [place], [place] [address] . 10 am to 8 pm Monday-Friday . 12 pm to 8 pm Saturday-Sunday   . [facility]  [phone] Get Driving Directions Find a Provider at this Location  [address], [address] [place], [place] [address] . 8 am to 8 pm Monday-Friday . 9 am to 6 pm Saturday . 11 am to 6 pm Sunday   . [facility]  [phone] Get Driving Directions  [address] [place], [place] [address] . 8 am to 8 pm Monday-Friday . 8 am to 4 pm Saturday-Sunday   Your e-visit answers were reviewed by a board certified advanced clinical practitioner to complete your personal care plan.  Thank you for using e-Visits.  

## 2017-06-18 ENCOUNTER — Encounter: Payer: Self-pay | Admitting: Nurse Practitioner

## 2017-07-03 ENCOUNTER — Ambulatory Visit (INDEPENDENT_AMBULATORY_CARE_PROVIDER_SITE_OTHER): Payer: BLUE CROSS/BLUE SHIELD | Admitting: Nurse Practitioner

## 2017-07-03 ENCOUNTER — Encounter: Payer: Self-pay | Admitting: Nurse Practitioner

## 2017-07-03 ENCOUNTER — Ambulatory Visit (HOSPITAL_COMMUNITY)
Admission: RE | Admit: 2017-07-03 | Discharge: 2017-07-03 | Disposition: A | Discharge status: Home / Self Care | Payer: BLUE CROSS/BLUE SHIELD | Source: Ambulatory Visit | Attending: Family Medicine | Admitting: Family Medicine

## 2017-07-03 VITALS — BP 130/80 | Ht 61.0 in | Wt 167.0 lb

## 2017-07-03 DIAGNOSIS — R5383 Other fatigue: Secondary | ICD-10-CM

## 2017-07-03 DIAGNOSIS — Z01419 Encounter for gynecological examination (general) (routine) without abnormal findings: Secondary | ICD-10-CM

## 2017-07-03 DIAGNOSIS — Z1231 Encounter for screening mammogram for malignant neoplasm of breast: Secondary | ICD-10-CM | POA: Insufficient documentation

## 2017-07-03 DIAGNOSIS — F430 Acute stress reaction: Secondary | ICD-10-CM

## 2017-07-03 DIAGNOSIS — E039 Hypothyroidism, unspecified: Secondary | ICD-10-CM

## 2017-07-03 DIAGNOSIS — F411 Generalized anxiety disorder: Secondary | ICD-10-CM | POA: Diagnosis not present

## 2017-07-03 LAB — POCT HEMOGLOBIN: Hemoglobin: 15.2 g/dL (ref 12.2–16.2)

## 2017-07-03 MED ORDER — CITALOPRAM HYDROBROMIDE 20 MG PO TABS: 20.0000 mg | ORAL_TABLET | Freq: Every day | ORAL | 1 refills | Status: DC

## 2017-07-04 ENCOUNTER — Encounter: Payer: Self-pay | Admitting: Nurse Practitioner

## 2017-07-04 DIAGNOSIS — F430 Acute stress reaction: Secondary | ICD-10-CM | POA: Insufficient documentation

## 2017-07-04 DIAGNOSIS — F411 Generalized anxiety disorder: Secondary | ICD-10-CM | POA: Insufficient documentation

## 2017-07-04 LAB — BASIC METABOLIC PANEL
BUN/Creatinine Ratio: 25 — ABNORMAL HIGH (ref 9–23)
BUN: 15 mg/dL (ref 6–24)
CO2: 26 mmol/L (ref 20–29)
CREATININE: 0.61 mg/dL (ref 0.57–1.00)
Calcium: 9.2 mg/dL (ref 8.7–10.2)
Chloride: 101 mmol/L (ref 96–106)
GFR calc Af Amer: 129 mL/min/{1.73_m2} (ref >59)
GFR, EST NON AFRICAN AMERICAN: 112 mL/min/{1.73_m2} (ref >59)
Glucose: 92 mg/dL (ref 65–99)
Potassium: 4.6 mmol/L (ref 3.5–5.2)
SODIUM: 140 mmol/L (ref 134–144)

## 2017-07-04 LAB — HEPATIC FUNCTION PANEL
ALBUMIN: 4.3 g/dL (ref 3.5–5.5)
ALK PHOS: 36 IU/L — AB (ref 39–117)
ALT: 13 IU/L (ref 0–32)
AST: 12 IU/L (ref 0–40)
Bilirubin Total: 0.4 mg/dL (ref 0.0–1.2)
Bilirubin, Direct: 0.1 mg/dL (ref 0.00–0.40)
TOTAL PROTEIN: 7.1 g/dL (ref 6.0–8.5)

## 2017-07-04 LAB — VITAMIN D 25 HYDROXY (VIT D DEFICIENCY, FRACTURES): VIT D 25 HYDROXY: 21.7 ng/mL — AB (ref 30.0–100.0)

## 2017-07-04 LAB — LIPID PANEL
Chol/HDL Ratio: 4.1 ratio (ref 0.0–4.4)
Cholesterol, Total: 193 mg/dL (ref 100–199)
HDL: 47 mg/dL (ref >39)
LDL Calculated: 124 mg/dL — ABNORMAL HIGH (ref 0–99)
Triglycerides: 110 mg/dL (ref 0–149)
VLDL CHOLESTEROL CAL: 22 mg/dL (ref 5–40)

## 2017-07-04 LAB — TSH: TSH: 3.65 u[IU]/mL (ref 0.450–4.500)

## 2017-07-04 NOTE — Progress Notes (Signed)
Subjective:    Patient ID: Jill James, female    DOB: 10/24/1976, 41 y.o.   MRN: [id number]  HPI presents for her wellness exam. Same sexual partner. 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 Plans to get mammogram and labs today. Needs vision and dental exams. Very active lifestyle due to her occupation. Anxious and depressed related to their lifestyle. Wants to settle down and have a home. Multiple family issues. Denies suicidal or homicidal thoughts or ideation. C/o hair loss, emotional lability and fatigue. Still has both ovaries.     Review of Systems  Constitutional: Positive for fatigue. Negative for activity change and appetite change.  HENT: Negative for dental problem, ear pain, sinus pressure and sore throat.   Respiratory: Negative for cough, chest tightness, shortness of breath and wheezing.   Cardiovascular: Negative for chest pain.  Gastrointestinal: Negative for abdominal distention, abdominal pain, constipation, diarrhea, nausea and vomiting.  Genitourinary: Negative for difficulty urinating, dysuria, enuresis, frequency, genital sores, pelvic pain, urgency and vaginal discharge.  Psychiatric/Behavioral: Positive for dysphoric mood and sleep disturbance. Negative for suicidal ideas. The patient is nervous/anxious.        Objective:   Physical Exam  Constitutional: She is oriented to person, place, and time. She appears well-developed. No distress.  HENT:  Right Ear: External ear normal.  Left Ear: External ear normal.  Mouth/Throat: Oropharynx is clear and moist.  Neck: Normal range of motion. Neck supple. No tracheal deviation present. No thyromegaly present.  Cardiovascular: Normal rate, regular rhythm and normal heart sounds.  Exam reveals no gallop.   No murmur heard. Pulmonary/Chest: Effort normal and breath sounds normal.  Abdominal: Soft. She exhibits no distension. There is no tenderness.  Genitourinary: Vagina  normal. No vaginal discharge found.  Genitourinary Comments: GU: no rashes or lesions. Vagina: no discharge. Bimanual exam: no tenderness or obvious masses.   Musculoskeletal: She exhibits no edema.  Lymphadenopathy:    She has no cervical adenopathy.  Neurological: She is alert and oriented to person, place, and time.  Skin: Skin is warm and dry. No rash noted.  Psychiatric: She has a normal mood and affect. Her behavior is normal.  Vitals reviewed. Breast exam: no masses; axillae no adenopathy.         Assessment & Plan:   Problem List Items Addressed This Visit      Endocrine   Hypothyroidism     Other   Anxiety as acute reaction to exceptional stress   Relevant Medications   citalopram (CELEXA) 20 MG tablet    Other Visit Diagnoses    Well woman exam    -  Primary   Fatigue, unspecified type       Relevant Orders   POCT hemoglobin (Completed)     Meds ordered this encounter  Medications  . citalopram (CELEXA) 20 MG tablet    Sig: Take 1 tablet (20 mg total) by mouth daily.    Dispense:  90 tablet    Refill:  1    Order Specific Question:   Supervising Provider    Answer:   Merlyn AlbertLUKING, WILLIAM S [[id number]]   Discussed importance of stress reduction. Start Celexa 1/2 tab daily, may increase to one tab if needed. Drowsiness precautions. 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  Recheck here when she is back in town. Send note through my chart regarding Celexa. DC if any adverse effects. An  extra 15 minutes was spent in discussion during this visit.

## 2017-07-06 ENCOUNTER — Other Ambulatory Visit: Payer: Self-pay | Admitting: Family Medicine

## 2017-07-06 ENCOUNTER — Telehealth: Payer: Self-pay | Admitting: Family Medicine

## 2017-07-06 DIAGNOSIS — N6320 Unspecified lump in the left breast, unspecified quadrant: Secondary | ICD-10-CM

## 2017-07-06 MED ORDER — LEVOTHYROXINE SODIUM 50 MCG PO TABS: 50.0000 ug | ORAL_TABLET | Freq: Every day | ORAL | 0 refills | Status: DC

## 2017-07-06 NOTE — Telephone Encounter (Signed)
Patient is needing Rx for levothyroxine called in today.  She said Jill James was waiting for her lab results to come in, but she is needing medication before she leaves out of town in the morning.

## 2017-07-06 NOTE — Telephone Encounter (Signed)
Prescription sent electronically to pharmacy. Patient notified. 

## 2017-07-07 ENCOUNTER — Encounter: Payer: Self-pay | Admitting: Nurse Practitioner

## 2017-07-07 ENCOUNTER — Other Ambulatory Visit: Payer: Self-pay | Admitting: Nurse Practitioner

## 2017-07-07 MED ORDER — LEVOTHYROXINE SODIUM 50 MCG PO TABS: 50.0000 ug | ORAL_TABLET | Freq: Every day | ORAL | 3 refills | Status: AC

## 2017-07-28 ENCOUNTER — Telehealth: Payer: Self-pay | Admitting: Family Medicine

## 2017-07-28 ENCOUNTER — Other Ambulatory Visit: Payer: Self-pay | Admitting: *Deleted

## 2017-07-28 MED ORDER — CITALOPRAM HYDROBROMIDE 20 MG PO TABS: 20.0000 mg | ORAL_TABLET | Freq: Every day | ORAL | 0 refills | Status: DC

## 2017-07-28 NOTE — Telephone Encounter (Signed)
ok 

## 2017-07-28 NOTE — Telephone Encounter (Signed)
Left message to return call to find out which pharm

## 2017-07-28 NOTE — Telephone Encounter (Signed)
Pt called back. Med [facility] [place]

## 2017-07-28 NOTE — Telephone Encounter (Signed)
Last seen 07/03/17
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
## 2017-08-11 ENCOUNTER — Ambulatory Visit (HOSPITAL_COMMUNITY)
Admission: RE | Admit: 2017-08-11 | Discharge: 2017-08-11 | Disposition: A | Discharge status: Home / Self Care | Payer: BLUE CROSS/BLUE SHIELD | Source: Ambulatory Visit | Attending: Family Medicine | Admitting: Family Medicine

## 2017-08-11 DIAGNOSIS — N6320 Unspecified lump in the left breast, unspecified quadrant: Secondary | ICD-10-CM

## 2017-11-24 ENCOUNTER — Other Ambulatory Visit: Payer: Self-pay | Admitting: Nurse Practitioner

## 2017-11-24 ENCOUNTER — Telehealth: Payer: Self-pay | Admitting: Family Medicine

## 2017-11-24 MED ORDER — CITALOPRAM HYDROBROMIDE 20 MG PO TABS: 20.0000 mg | ORAL_TABLET | Freq: Every day | ORAL | 1 refills | Status: AC

## 2017-11-24 NOTE — Telephone Encounter (Signed)
Pt is needing refills on citalopram (CELEXA) 20 MG tablet PT IS REQUESTING 90 DAY SUPPLY  [facility] [place]  [phone]

## 2017-11-24 NOTE — Telephone Encounter (Signed)
Done
